# Patient Record
Sex: Male | Born: 1955 | Race: White | Hispanic: No | Marital: Married | Smoking: Current some day smoker
Health system: Southern US, Community
[De-identification: ages and names within clinical notes are randomized; demographics above are authoritative.]

## PROBLEM LIST (undated history)

## (undated) DIAGNOSIS — G459 Transient cerebral ischemic attack, unspecified: Secondary | ICD-10-CM

## (undated) DIAGNOSIS — I639 Cerebral infarction, unspecified: Secondary | ICD-10-CM

## (undated) DIAGNOSIS — I10 Essential (primary) hypertension: Secondary | ICD-10-CM

## (undated) HISTORY — PX: APPENDECTOMY: SHX54

---

## 2016-03-13 ENCOUNTER — Encounter (HOSPITAL_COMMUNITY): Payer: Self-pay | Admitting: *Deleted

## 2016-03-13 ENCOUNTER — Emergency Department (HOSPITAL_COMMUNITY): Payer: Self-pay

## 2016-03-13 ENCOUNTER — Emergency Department (HOSPITAL_COMMUNITY)
Admission: EM | Admit: 2016-03-13 | Discharge: 2016-03-13 | Disposition: A | Discharge status: Home / Self Care | Payer: Self-pay | Attending: Emergency Medicine | Admitting: Emergency Medicine

## 2016-03-13 DIAGNOSIS — F17200 Nicotine dependence, unspecified, uncomplicated: Secondary | ICD-10-CM | POA: Insufficient documentation

## 2016-03-13 DIAGNOSIS — I10 Essential (primary) hypertension: Secondary | ICD-10-CM | POA: Insufficient documentation

## 2016-03-13 DIAGNOSIS — R791 Abnormal coagulation profile: Secondary | ICD-10-CM | POA: Insufficient documentation

## 2016-03-13 DIAGNOSIS — R42 Dizziness and giddiness: Secondary | ICD-10-CM | POA: Insufficient documentation

## 2016-03-13 HISTORY — DX: Transient cerebral ischemic attack, unspecified: G45.9

## 2016-03-13 HISTORY — DX: Essential (primary) hypertension: I10

## 2016-03-13 LAB — RAPID URINE DRUG SCREEN, HOSP PERFORMED
AMPHETAMINES: NOT DETECTED
Barbiturates: NOT DETECTED
Benzodiazepines: NOT DETECTED
Cocaine: NOT DETECTED
OPIATES: NOT DETECTED
TETRAHYDROCANNABINOL: POSITIVE — AB

## 2016-03-13 LAB — CBC
HEMATOCRIT: 47.4 % (ref 39.0–52.0)
Hemoglobin: 16.5 g/dL (ref 13.0–17.0)
MCH: 32.1 pg (ref 26.0–34.0)
MCHC: 34.8 g/dL (ref 30.0–36.0)
MCV: 92.2 fL (ref 78.0–100.0)
PLATELETS: 271 10*3/uL (ref 150–400)
RBC: 5.14 MIL/uL (ref 4.22–5.81)
RDW: 13.5 % (ref 11.5–15.5)
WBC: 11.5 10*3/uL — ABNORMAL HIGH (ref 4.0–10.5)

## 2016-03-13 LAB — COMPREHENSIVE METABOLIC PANEL
ALBUMIN: 4.6 g/dL (ref 3.5–5.0)
ALT: 19 U/L (ref 17–63)
AST: 18 U/L (ref 15–41)
Alkaline Phosphatase: 56 U/L (ref 38–126)
Anion gap: 8 (ref 5–15)
BUN: 12 mg/dL (ref 6–20)
CHLORIDE: 106 mmol/L (ref 101–111)
CO2: 23 mmol/L (ref 22–32)
Calcium: 9.3 mg/dL (ref 8.9–10.3)
Creatinine, Ser: 0.94 mg/dL (ref 0.61–1.24)
GFR calc Af Amer: >60 mL/min (ref >60)
GFR calc non Af Amer: >60 mL/min (ref >60)
GLUCOSE: 105 mg/dL — AB (ref 65–99)
POTASSIUM: 3.7 mmol/L (ref 3.5–5.1)
Sodium: 137 mmol/L (ref 135–145)
Total Bilirubin: 1 mg/dL (ref 0.3–1.2)
Total Protein: 7.4 g/dL (ref 6.5–8.1)

## 2016-03-13 LAB — DIFFERENTIAL
BASOS PCT: 0 %
Basophils Absolute: 0 10*3/uL (ref 0.0–0.1)
EOS ABS: 0 10*3/uL (ref 0.0–0.7)
Eosinophils Relative: 0 %
LYMPHS PCT: 11 %
Lymphs Abs: 1.3 10*3/uL (ref 0.7–4.0)
Monocytes Absolute: 0.8 10*3/uL (ref 0.1–1.0)
Monocytes Relative: 7 %
NEUTROS ABS: 9.4 10*3/uL — AB (ref 1.7–7.7)
Neutrophils Relative %: 82 %

## 2016-03-13 LAB — URINALYSIS, ROUTINE W REFLEX MICROSCOPIC
BILIRUBIN URINE: NEGATIVE
Glucose, UA: NEGATIVE mg/dL
Hgb urine dipstick: NEGATIVE
Leukocytes, UA: NEGATIVE
NITRITE: NEGATIVE
PH: 5.5 (ref 5.0–8.0)
Protein, ur: NEGATIVE mg/dL
Specific Gravity, Urine: >1.03 — ABNORMAL HIGH (ref 1.005–1.030)

## 2016-03-13 LAB — APTT: aPTT: 31 seconds (ref 24–36)

## 2016-03-13 LAB — PROTIME-INR
INR: 0.94
PROTHROMBIN TIME: 12.6 s (ref 11.4–15.2)

## 2016-03-13 LAB — ETHANOL: Alcohol, Ethyl (B): 5 mg/dL — ABNORMAL HIGH (ref <5)

## 2016-03-13 NOTE — Discharge Instructions (Signed)
Follow up with a primary care doctor in a week or so to be rechecked, return as needed for worsening symptoms

## 2016-03-13 NOTE — ED Notes (Signed)
Per pt, ok to give any information to friend, Cala BradfordKimberly.

## 2016-03-13 NOTE — ED Provider Notes (Signed)
[facility]-EMERGENCY DEPT Provider Note   CSN: [id number] Arrival date & time: 03/13/16  1559     History   Chief Complaint Chief Complaint  Patient presents with  . Dizziness    HPI Roberto Fitzpatrick is a 60 y.o. male.  HPI Pt states his family told him to get checked out.  He was told that one side of his face was drawing and then it switched to the other side of his face.  No trouble with his speech.  No weakness on one side of the other.  He did noticed some numbness in the tip of his right index finger though.  No falls.  Pt is still walking ok.  No headache.   He does have history of TIA.  He does feel a little unsteady but that has been for years.  Right now he feels fine. Past Medical History:  Diagnosis Date  . Hypertension   . TIA (transient ischemic attack)     There are no active problems to display for this patient.   Past Surgical History:  Procedure Laterality Date  . APPENDECTOMY         Home Medications    Prior to Admission medications   Not on File    Family History No family history on file.  Social History Social History  Substance Use Topics  . Smoking status: Current Some Day Smoker  . Smokeless tobacco: Never Used  . Alcohol use Yes     Allergies   Review of patient's allergies indicates no known allergies.   Review of Systems Review of Systems  All other systems reviewed and are negative.    Physical Exam Updated Vital Signs BP 117/73   Pulse (!) 58   Temp 98.6 F (37 C)   Resp 17   Ht 5\' 9"  (1.753 m)   Wt 68 kg   SpO2 95%   BMI 22.15 kg/m   Physical Exam  Constitutional: He is oriented to person, place, and time. He appears well-developed and well-nourished. No distress.  HENT:  Head: Normocephalic and atraumatic.  Right Ear: External ear normal.  Left Ear: External ear normal.  Mouth/Throat: Oropharynx is clear and moist.  Eyes: Conjunctivae are normal. Right eye exhibits no discharge. Left eye exhibits no  discharge. No scleral icterus.  Neck: Neck supple. No tracheal deviation present.  Cardiovascular: Normal rate, regular rhythm and intact distal pulses.   Pulmonary/Chest: Effort normal and breath sounds normal. No stridor. No respiratory distress. He has no wheezes. He has no rales.  Abdominal: Soft. Bowel sounds are normal. He exhibits no distension. There is no tenderness. There is no rebound and no guarding.  Musculoskeletal: He exhibits no edema or tenderness.  Neurological: He is alert and oriented to person, place, and time. He has normal strength. No cranial nerve deficit (No facial droop, extraocular movements intact, tongue midline ) or sensory deficit. He exhibits normal muscle tone. He displays no seizure activity. Coordination normal.  No pronator drift bilateral upper extrem, able to hold both legs off bed for 5 seconds, sensation intact in all extremities, no visual field cuts, no left or right sided neglect, normal finger-nose exam bilaterally, no nystagmus noted   Skin: Skin is warm and dry. No rash noted.  Psychiatric: He has a normal mood and affect.  Nursing note and vitals reviewed.    ED Treatments / Results  Labs (all labs ordered are listed, but only abnormal results are displayed) Labs Reviewed  ETHANOL - Abnormal;  Notable for the following:       Result Value   Alcohol, Ethyl (B) 5 (*)    All other components within normal limits  CBC - Abnormal; Notable for the following:    WBC 11.5 (*)    All other components within normal limits  DIFFERENTIAL - Abnormal; Notable for the following:    Neutro Abs 9.4 (*)    All other components within normal limits  COMPREHENSIVE METABOLIC PANEL - Abnormal; Notable for the following:    Glucose, Bld 105 (*)    All other components within normal limits  RAPID URINE DRUG SCREEN, HOSP PERFORMED - Abnormal; Notable for the following:    Tetrahydrocannabinol POSITIVE (*)    All other components within normal limits    URINALYSIS, ROUTINE W REFLEX MICROSCOPIC (NOT AT [facility]) - Abnormal; Notable for the following:    Specific Gravity, Urine >1.030 (*)    Ketones, ur TRACE (*)    All other components within normal limits  PROTIME-INR  APTT  I-STAT TROPOININ, ED    Radiology Ct Head Wo Contrast  Result Date: 03/13/2016 CLINICAL DATA:  Status post fall very EXAM: CT HEAD WITHOUT CONTRAST TECHNIQUE: Contiguous axial images were obtained from the base of the skull through the vertex without intravenous contrast. COMPARISON:  None. FINDINGS: Brain: There is no evidence for acute hemorrhage, hydrocephalus, mass lesion, or abnormal extra-axial fluid collection. No definite CT evidence for acute infarction. Diffuse loss of parenchymal volume is consistent with atrophy. Patchy low attenuation in the deep hemispheric and periventricular white matter is nonspecific, but likely reflects chronic microvascular ischemic demyelination. Vascular: Atherosclerotic calcification is visualized in the carotid arteries. No dense MCA sign. Major dural sinuses are unremarkable. Skull: Normal. Negative for fracture or focal lesion. Diffusely demineralized. Sinuses/Orbits: No acute finding. Other: None. IMPRESSION: 1. No acute intracranial abnormality. 2. Atrophy with chronic small vessel white matter ischemic disease. Electronically Signed   By: Kennith CenterEric  Mansell M.D.   On: 03/13/2016 17:25    Procedures Procedures (including critical care time)  Medications Ordered in ED Medications - No data to display   Initial Impression / Assessment and Plan / ED Course  I have reviewed the triage vital signs and the nursing notes.  Pertinent labs & imaging results that were available during my care of the patient were reviewed by me and considered in my medical decision making (see chart for details).  Clinical Course    Exam is normal.  No facial droop.  No ataxia.  Pt denies any symptoms.  Labs and CT are reassuring.  Doubt TIA,  stroke.  At this time there does not appear to be any evidence of an acute emergency medical condition and the patient appears stable for discharge with appropriate outpatient follow up.   Final Clinical Impressions(s) / ED Diagnoses   Final diagnoses:  Dizziness    New Prescriptions New Prescriptions   No medications on file     Linwood DibblesJon Dayn Barich, MD 03/13/16 2024

## 2016-03-13 NOTE — ED Triage Notes (Signed)
Per ems, friend came by and states he had fallen, patient denies any fall ,but states he is unsteady on his feet. History of TIA'S

## 2016-03-14 LAB — I-STAT TROPONIN, ED: Troponin i, poc: 0 ng/mL (ref 0.00–0.08)

## 2017-06-14 ENCOUNTER — Other Ambulatory Visit: Payer: Self-pay

## 2017-06-14 ENCOUNTER — Emergency Department (HOSPITAL_COMMUNITY)
Admission: EM | Admit: 2017-06-14 | Discharge: 2017-06-15 | Disposition: A | Discharge status: Home / Self Care | Payer: Self-pay | Attending: Emergency Medicine | Admitting: Emergency Medicine

## 2017-06-14 ENCOUNTER — Encounter (HOSPITAL_COMMUNITY): Payer: Self-pay | Admitting: Emergency Medicine

## 2017-06-14 DIAGNOSIS — I10 Essential (primary) hypertension: Secondary | ICD-10-CM | POA: Insufficient documentation

## 2017-06-14 DIAGNOSIS — W19XXXA Unspecified fall, initial encounter: Secondary | ICD-10-CM

## 2017-06-14 DIAGNOSIS — F17200 Nicotine dependence, unspecified, uncomplicated: Secondary | ICD-10-CM | POA: Insufficient documentation

## 2017-06-14 DIAGNOSIS — M79671 Pain in right foot: Secondary | ICD-10-CM | POA: Insufficient documentation

## 2017-06-14 DIAGNOSIS — Z8673 Personal history of transient ischemic attack (TIA), and cerebral infarction without residual deficits: Secondary | ICD-10-CM | POA: Insufficient documentation

## 2017-06-14 DIAGNOSIS — R2689 Other abnormalities of gait and mobility: Secondary | ICD-10-CM

## 2017-06-14 DIAGNOSIS — F10929 Alcohol use, unspecified with intoxication, unspecified: Secondary | ICD-10-CM | POA: Insufficient documentation

## 2017-06-14 DIAGNOSIS — R51 Headache: Secondary | ICD-10-CM | POA: Insufficient documentation

## 2017-06-14 DIAGNOSIS — W109XXA Fall (on) (from) unspecified stairs and steps, initial encounter: Secondary | ICD-10-CM | POA: Insufficient documentation

## 2017-06-14 DIAGNOSIS — Y998 Other external cause status: Secondary | ICD-10-CM | POA: Insufficient documentation

## 2017-06-14 DIAGNOSIS — Y92009 Unspecified place in unspecified non-institutional (private) residence as the place of occurrence of the external cause: Secondary | ICD-10-CM

## 2017-06-14 DIAGNOSIS — F1010 Alcohol abuse, uncomplicated: Secondary | ICD-10-CM

## 2017-06-14 DIAGNOSIS — Y939 Activity, unspecified: Secondary | ICD-10-CM | POA: Insufficient documentation

## 2017-06-14 DIAGNOSIS — Y929 Unspecified place or not applicable: Secondary | ICD-10-CM | POA: Insufficient documentation

## 2017-06-14 HISTORY — DX: Cerebral infarction, unspecified: I63.9

## 2017-06-14 NOTE — ED Provider Notes (Signed)
[facility] EMERGENCY DEPARTMENT Provider Note   CSN: [id number] Arrival date & time: 06/14/17  2148     History   Chief Complaint Chief Complaint  Patient presents with  . Fall    HPI Roberto Fitzpatrick is a 62 y.o. male.  Patient is a 62 year old male who presents to the emergency department by rocking him [facility] after falling down steps.  The history is provided by both the patient and the family.  The family says that at times he gets some things confused because he has had possibly 3 strokes.  The family states that the patient frequently has fall.  He has a problem with his balance.  Today he fell down approximately 10 or 11 steps at his home.  The patient states that he has been drinking a 20 ounce beer, but the patient states that that had nothing to do with his fall.  The patient denies pain at this time.  He said initially he had some pain of his head, but he does not have any pain now.  He thinks that he may have passed out for short period of time, but says he woke up on his own.  Patient denies being on any anticoagulation medications at this time.  He presents now for assistance with this issue.      Past Medical History:  Diagnosis Date  . Hypertension   . Stroke (HCC)   . TIA (transient ischemic attack)     There are no active problems to display for this patient.   Past Surgical History:  Procedure Laterality Date  . APPENDECTOMY         Home Medications    Prior to Admission medications   Not on File    Family History No family history on file.  Social History Social History   Tobacco Use  . Smoking status: Current Some Day Smoker  . Smokeless tobacco: Never Used  Substance Use Topics  . Alcohol use: Yes  . Drug use: No     Allergies   Patient has no known allergies.   Review of Systems Review of Systems  Constitutional: Negative for activity change.       All ROS Neg except as noted in HPI  HENT: Negative for nosebleeds.     Eyes: Negative for photophobia and discharge.  Respiratory: Negative for cough, shortness of breath and wheezing.   Cardiovascular: Negative for chest pain and palpitations.  Gastrointestinal: Negative for abdominal pain and blood in stool.  Genitourinary: Negative for dysuria, frequency and hematuria.  Musculoskeletal: Negative for arthralgias, back pain and neck pain.  Skin: Negative.   Neurological: Positive for speech difficulty and light-headedness. Negative for dizziness and seizures.  Psychiatric/Behavioral: Negative for confusion and hallucinations.     Physical Exam Updated Vital Signs BP (!) 147/98 (BP Location: Right Arm)   Pulse 92   Temp 97.7 F (36.5 C) (Oral)   Resp 18   Ht 5\' 9"  (1.753 m)   Wt 65.8 kg (145 lb)   SpO2 97%   BMI 21.41 kg/m   Physical Exam  Constitutional: He is oriented to person, place, and time. He appears well-developed and well-nourished.  Non-toxic appearance.  HENT:  Head: Normocephalic.  Right Ear: Tympanic membrane and external ear normal.  Left Ear: Tympanic membrane and external ear normal.  No hematoma.  Negative Battle's sign.  Eyes: EOM and lids are normal. Pupils are equal, round, and reactive to light.  Neck: Normal range of motion.  Neck supple. Carotid bruit is not present. No tracheal deviation present.  Cardiovascular: Normal rate, regular rhythm, normal heart sounds, intact distal pulses and normal pulses.  Pulmonary/Chest: Breath sounds normal. No respiratory distress.  Abdominal: Soft. Bowel sounds are normal. There is no tenderness. There is no guarding.  Musculoskeletal: Normal range of motion. He exhibits no edema, tenderness or deformity.  Lymphadenopathy:       Head (right side): No submandibular adenopathy present.       Head (left side): No submandibular adenopathy present.    He has no cervical adenopathy.  Neurological: He is alert and oriented to person, place, and time. He has normal strength. No cranial nerve  deficit or sensory deficit. Coordination abnormal.  Patient has a problem with balance.  He also has some mild problems with finger to nose coordination.  The family states this is not new.  This is been going on since the patient's previous strokes.  Skin: Skin is warm and dry.  Psychiatric: He has a normal mood and affect. His speech is normal.  Nursing note and vitals reviewed.    ED Treatments / Results  Labs (all labs ordered are listed, but only abnormal results are displayed) Labs Reviewed - No data to display  EKG  EKG Interpretation None       Radiology No results found.  Procedures Procedures (including critical care time)  Medications Ordered in ED Medications - No data to display   Initial Impression / Assessment and Plan / ED Course  I have reviewed the triage vital signs and the nursing notes.  Pertinent labs & imaging results that were available during my care of the patient were reviewed by me and considered in my medical decision making (see chart for details).       Final Clinical Impressions(s) / ED Diagnoses MDM Vital signs within normal limits.  Pulse oximetry is 96% on room air.  Within normal limits by my interpretation.  The patient at times has some problems speaking and getting his thoughts together, but the family states this is his usual baseline.  He has some mild coordination issues and they state these have been going on since his previous strokes.  EtOH level is 33 t, which is mildly elevated.   Patient states that he drinks alcohol every day.  He chest x-ray is negative for any fractures or changes in the lungs or chest area.  The CT scan of the head is negative for acute intracranial abnormality.  There is no skull fracture noted.  CT scan of the cervical spine reveals some degenerative disc disease at C5-C6 and arthritis at multiple areas.  There is no fracture or dislocation appreciated.  Patient was ambulated in the room with assistance.   Feel that it is safe for the patient to be discharged home with his family and friends.  He has a caregiver that is in the home with him at all times.  The family is asked to see the primary physician or return to the emergency department if any changes, problems, or concerns.  Family and friends are going to take the patient home.  Patient has a caregiver at home at all times.   Final diagnoses:  Fall, initial encounter  ETOH abuse    ED Discharge Orders    None       Ivery Quale, PA-C 06/15/17 Gennaro Africa, MD 06/15/17 [phone]

## 2017-06-14 NOTE — ED Triage Notes (Signed)
Brought in [facility] after falling down approximately 10-12 steps at home. Pt has admitted to drinking tonight. States he only drank 20oz beer but talks like he has had more. No injuries noted. CBG by EMS was 73.

## 2017-06-15 ENCOUNTER — Emergency Department (HOSPITAL_COMMUNITY): Payer: Self-pay

## 2017-06-15 LAB — URINALYSIS, ROUTINE W REFLEX MICROSCOPIC
Bacteria, UA: NONE SEEN
Bilirubin Urine: NEGATIVE
GLUCOSE, UA: 50 mg/dL — AB
Ketones, ur: NEGATIVE mg/dL
Leukocytes, UA: NEGATIVE
Nitrite: NEGATIVE
Protein, ur: NEGATIVE mg/dL
SPECIFIC GRAVITY, URINE: 1.017 (ref 1.005–1.030)
pH: 5 (ref 5.0–8.0)

## 2017-06-15 LAB — BASIC METABOLIC PANEL
ANION GAP: 12 (ref 5–15)
BUN: 15 mg/dL (ref 6–20)
CO2: 25 mmol/L (ref 22–32)
Calcium: 9.3 mg/dL (ref 8.9–10.3)
Chloride: 100 mmol/L — ABNORMAL LOW (ref 101–111)
Creatinine, Ser: 0.97 mg/dL (ref 0.61–1.24)
Glucose, Bld: 88 mg/dL (ref 65–99)
Potassium: 3.7 mmol/L (ref 3.5–5.1)
SODIUM: 137 mmol/L (ref 135–145)

## 2017-06-15 LAB — CBC WITH DIFFERENTIAL/PLATELET
BASOS ABS: 0 10*3/uL (ref 0.0–0.1)
BASOS PCT: 0 %
EOS ABS: 0.1 10*3/uL (ref 0.0–0.7)
Eosinophils Relative: 0 %
HCT: 48 % (ref 39.0–52.0)
HEMOGLOBIN: 16.6 g/dL (ref 13.0–17.0)
LYMPHS ABS: 1.3 10*3/uL (ref 0.7–4.0)
Lymphocytes Relative: 8 %
MCH: 31.8 pg (ref 26.0–34.0)
MCHC: 34.6 g/dL (ref 30.0–36.0)
MCV: 92 fL (ref 78.0–100.0)
Monocytes Absolute: 0.9 10*3/uL (ref 0.1–1.0)
Monocytes Relative: 5 %
NEUTROS PCT: 87 %
Neutro Abs: 14.3 10*3/uL — ABNORMAL HIGH (ref 1.7–7.7)
Platelets: 302 10*3/uL (ref 150–400)
RBC: 5.22 MIL/uL (ref 4.22–5.81)
RDW: 13.7 % (ref 11.5–15.5)
WBC: 16.6 10*3/uL — AB (ref 4.0–10.5)

## 2017-06-15 LAB — ETHANOL: ALCOHOL ETHYL (B): 33 mg/dL — AB (ref <10)

## 2017-06-15 NOTE — ED Notes (Signed)
Stood patient up at side of bed, pt had a hard time walking, lost his balance several times. Family at bedside stated patient walks and has several falls at home from loosing balance. Pt states he is ready to go home. Pt seems to have a sore foot on the left side. Pt is able to do full range of motion and when assessing foot there is no redness or abrasion.

## 2017-06-15 NOTE — Discharge Instructions (Addendum)
Vital signs within normal limits.  Pulse oximetry is 96% on room air.  Within normal limits by my interpretation.  Your alcohol level is slightly elevated at 33.  The chest x-ray is negative for rib fracture or other injuries or trauma to the chest area.  The CT scan of the head is negative for skull fracture or brain related injury.  The CT scan of the cervical spine is negative for fracture or dislocation.  There are arthritis changes throughout the cervical spine.  There is degenerative disc disease particularly at the C5-C6 area. Your electrolytes are within normal limits.  Please use Tylenol every 4 hours for soreness, or ibuprofen every 6 hours if needed for soreness.  Please refrain from using alcohol.  Please see your primary physician, or return to the emergency department if any changes, problems, or concerns.

## 2018-03-25 ENCOUNTER — Encounter (HOSPITAL_COMMUNITY): Payer: Self-pay

## 2018-03-25 ENCOUNTER — Observation Stay (HOSPITAL_COMMUNITY)
Admission: EM | Admit: 2018-03-25 | Discharge: 2018-03-27 | Disposition: A | Discharge status: Home / Self Care | Payer: No Typology Code available for payment source | Attending: Family Medicine | Admitting: Family Medicine

## 2018-03-25 ENCOUNTER — Other Ambulatory Visit: Payer: Self-pay

## 2018-03-25 ENCOUNTER — Emergency Department (HOSPITAL_COMMUNITY): Payer: Self-pay

## 2018-03-25 DIAGNOSIS — F17200 Nicotine dependence, unspecified, uncomplicated: Secondary | ICD-10-CM | POA: Insufficient documentation

## 2018-03-25 DIAGNOSIS — K625 Hemorrhage of anus and rectum: Principal | ICD-10-CM | POA: Diagnosis present

## 2018-03-25 DIAGNOSIS — Z7982 Long term (current) use of aspirin: Secondary | ICD-10-CM | POA: Insufficient documentation

## 2018-03-25 DIAGNOSIS — K922 Gastrointestinal hemorrhage, unspecified: Secondary | ICD-10-CM

## 2018-03-25 DIAGNOSIS — Z79899 Other long term (current) drug therapy: Secondary | ICD-10-CM | POA: Insufficient documentation

## 2018-03-25 DIAGNOSIS — I639 Cerebral infarction, unspecified: Secondary | ICD-10-CM | POA: Diagnosis present

## 2018-03-25 DIAGNOSIS — Z8673 Personal history of transient ischemic attack (TIA), and cerebral infarction without residual deficits: Secondary | ICD-10-CM | POA: Insufficient documentation

## 2018-03-25 DIAGNOSIS — R4182 Altered mental status, unspecified: Secondary | ICD-10-CM | POA: Insufficient documentation

## 2018-03-25 DIAGNOSIS — I10 Essential (primary) hypertension: Secondary | ICD-10-CM | POA: Insufficient documentation

## 2018-03-25 DIAGNOSIS — K921 Melena: Secondary | ICD-10-CM | POA: Diagnosis present

## 2018-03-25 LAB — CBC WITH DIFFERENTIAL/PLATELET
ABS IMMATURE GRANULOCYTES: 0.03 10*3/uL (ref 0.00–0.07)
BASOS PCT: 0 %
Basophils Absolute: 0 10*3/uL (ref 0.0–0.1)
EOS ABS: 0 10*3/uL (ref 0.0–0.5)
Eosinophils Relative: 0 %
HCT: 42.9 % (ref 39.0–52.0)
Hemoglobin: 13.5 g/dL (ref 13.0–17.0)
Immature Granulocytes: 0 %
Lymphocytes Relative: 11 %
Lymphs Abs: 1 10*3/uL (ref 0.7–4.0)
MCH: 27.6 pg (ref 26.0–34.0)
MCHC: 31.5 g/dL (ref 30.0–36.0)
MCV: 87.7 fL (ref 80.0–100.0)
MONO ABS: 0.8 10*3/uL (ref 0.1–1.0)
Monocytes Relative: 9 %
NEUTROS ABS: 7.7 10*3/uL (ref 1.7–7.7)
Neutrophils Relative %: 80 %
PLATELETS: 442 10*3/uL — AB (ref 150–400)
RBC: 4.89 MIL/uL (ref 4.22–5.81)
RDW: 15.1 % (ref 11.5–15.5)
WBC: 9.6 10*3/uL (ref 4.0–10.5)
nRBC: 0 % (ref 0.0–0.2)

## 2018-03-25 LAB — RAPID URINE DRUG SCREEN, HOSP PERFORMED
Amphetamines: NOT DETECTED
BARBITURATES: NOT DETECTED
Benzodiazepines: NOT DETECTED
COCAINE: NOT DETECTED
Opiates: NOT DETECTED
TETRAHYDROCANNABINOL: POSITIVE — AB

## 2018-03-25 LAB — COMPREHENSIVE METABOLIC PANEL
ALT: 9 U/L (ref 0–44)
ANION GAP: 6 (ref 5–15)
AST: 16 U/L (ref 15–41)
Albumin: 3.7 g/dL (ref 3.5–5.0)
Alkaline Phosphatase: 98 U/L (ref 38–126)
BUN: 14 mg/dL (ref 8–23)
CALCIUM: 9.1 mg/dL (ref 8.9–10.3)
CO2: 27 mmol/L (ref 22–32)
Chloride: 104 mmol/L (ref 98–111)
Creatinine, Ser: 0.93 mg/dL (ref 0.61–1.24)
GFR calc Af Amer: >60 mL/min (ref >60)
Glucose, Bld: 98 mg/dL (ref 70–99)
POTASSIUM: 3.5 mmol/L (ref 3.5–5.1)
Sodium: 137 mmol/L (ref 135–145)
TOTAL PROTEIN: 7.1 g/dL (ref 6.5–8.1)
Total Bilirubin: 0.7 mg/dL (ref 0.3–1.2)

## 2018-03-25 LAB — AMMONIA: Ammonia: 11 umol/L (ref 9–35)

## 2018-03-25 LAB — URINALYSIS, ROUTINE W REFLEX MICROSCOPIC
Bacteria, UA: NONE SEEN
Bilirubin Urine: NEGATIVE
Glucose, UA: NEGATIVE mg/dL
Hgb urine dipstick: NEGATIVE
Ketones, ur: NEGATIVE mg/dL
Nitrite: NEGATIVE
Protein, ur: NEGATIVE mg/dL
SPECIFIC GRAVITY, URINE: 1.021 (ref 1.005–1.030)
pH: 6 (ref 5.0–8.0)

## 2018-03-25 LAB — TYPE AND SCREEN
ABO/RH(D): O POS
Antibody Screen: NEGATIVE

## 2018-03-25 LAB — PROTIME-INR
INR: 1
PROTHROMBIN TIME: 13.1 s (ref 11.4–15.2)

## 2018-03-25 LAB — LIPASE, BLOOD: Lipase: 32 U/L (ref 11–51)

## 2018-03-25 LAB — ETHANOL: Alcohol, Ethyl (B): <10 mg/dL (ref <10)

## 2018-03-25 MED ORDER — ONDANSETRON HCL 4 MG/2ML IJ SOLN: 4.0000 mg | Freq: Four times a day (QID) | INTRAMUSCULAR | Status: DC | PRN

## 2018-03-25 MED ORDER — POTASSIUM CHLORIDE IN NACL 40-0.9 MEQ/L-% IV SOLN
INTRAVENOUS | Status: DC
  Administered 2018-03-25: 75 mL/h via INTRAVENOUS
  Filled 2018-03-25: qty 1000

## 2018-03-25 MED ORDER — SODIUM CHLORIDE 0.9 % IV SOLN
INTRAVENOUS | Status: DC
  Administered 2018-03-25: 21:00:00 via INTRAVENOUS

## 2018-03-25 MED ORDER — SODIUM CHLORIDE 0.9 % IV SOLN
80.0000 mg | Freq: Once | INTRAVENOUS | Status: AC
  Administered 2018-03-26: 80 mg via INTRAVENOUS
  Filled 2018-03-25: qty 80

## 2018-03-25 MED ORDER — SODIUM CHLORIDE 0.9 % IV BOLUS
1000.0000 mL | Freq: Once | INTRAVENOUS | Status: AC
  Administered 2018-03-25: 1000 mL via INTRAVENOUS

## 2018-03-25 MED ORDER — ONDANSETRON HCL 4 MG PO TABS: 4.0000 mg | ORAL_TABLET | Freq: Four times a day (QID) | ORAL | Status: DC | PRN

## 2018-03-25 MED ORDER — SODIUM CHLORIDE 0.9 % IV SOLN
8.0000 mg/h | INTRAVENOUS | Status: DC
  Administered 2018-03-26: 8 mg/h via INTRAVENOUS
  Filled 2018-03-25 (×5): qty 80

## 2018-03-25 NOTE — ED Provider Notes (Signed)
[facility] EMERGENCY DEPARTMENT Provider Note   CSN: [id number] Arrival date & time: 03/25/18  1812     History   Chief Complaint Chief Complaint  Patient presents with  . Rectal Bleeding    HPI Roberto Fitzpatrick is a 62 y.o. male.  Pt presents to the ED today with rectal bleeding.  The pt is a poor historian and is unable to give much hx.  The pt's father is with him and he gives the hx.  The father reports that the pt has had black stools for about 6 months.  He has gone to the [place] for this, but the father does not know what pt's been told.  The woman who stays with this patient called the dad tonight and told him that he had bright red rectal bleeding.  Pt denies any pain.  Dad does not think pt has ever seen a GI doctor, but is unsure.  Pt has a hx of alcohol abuse, but father denies that pt has been drinking.  Father said he's been falling a lot.     Past Medical History:  Diagnosis Date  . Hypertension   . Stroke (HCC)   . TIA (transient ischemic attack)     There are no active problems to display for this patient.   Past Surgical History:  Procedure Laterality Date  . APPENDECTOMY          Home Medications    Prior to Admission medications   Medication Sig Start Date End Date Taking? Authorizing Provider  aspirin EC 81 MG tablet Take 81 mg by mouth daily.   Yes [provider]  donepezil (ARICEPT) 10 MG tablet Take 5 mg by mouth at bedtime.   Yes [provider]  Meclizine HCl 25 MG CHEW Chew 25 mg by mouth at bedtime.   Yes [provider]  simvastatin (ZOCOR) 20 MG tablet Take 20 mg by mouth daily.   Yes [provider]  vitamin B-12 (CYANOCOBALAMIN) 500 MCG tablet Take 500 mcg by mouth daily.   Yes [provider]    Family History No family history on file.  Social History Social History   Tobacco Use  . Smoking status: Current Some Day Smoker  . Smokeless tobacco: Never Used  Substance Use Topics  .  Alcohol use: Yes  . Drug use: No     Allergies   Patient has no known allergies.   Review of Systems Review of Systems  Gastrointestinal:       Rectal bleeding  All other systems reviewed and are negative.    Physical Exam Updated Vital Signs BP (!) 145/78   Pulse 72   Temp 98.2 F (36.8 C) (Oral)   Resp 14   Wt 68.5 kg   SpO2 98%   BMI 22.30 kg/m   Physical Exam  Constitutional: He appears well-developed and well-nourished.  HENT:  Head: Normocephalic and atraumatic.  Right Ear: External ear normal.  Left Ear: External ear normal.  Nose: Nose normal.  Mouth/Throat: Oropharynx is clear and moist.  Eyes: Conjunctivae and EOM are normal.  Pupils pinpoint  Neck: Normal range of motion. Neck supple.  Cardiovascular: Normal rate, regular rhythm, normal heart sounds and intact distal pulses.  Pulmonary/Chest: Effort normal and breath sounds normal.  Abdominal: Soft. Bowel sounds are normal.  Genitourinary: Rectal exam shows guaiac positive stool.  Musculoskeletal: Normal range of motion.  Neurological: He is alert.  Pt is awake and alert.  He answers questions  and follows commands, but it takes him awhile to do it.  Skin: Skin is warm. Capillary refill takes less than 2 seconds.  Psychiatric: His speech is delayed. He is slowed.  Nursing note and vitals reviewed.    ED Treatments / Results  Labs (all labs ordered are listed, but only abnormal results are displayed) Labs Reviewed  CBC WITH DIFFERENTIAL/PLATELET - Abnormal; Notable for the following components:      Result Value   Platelets 442 (*)    All other components within normal limits  COMPREHENSIVE METABOLIC PANEL  PROTIME-INR  AMMONIA  ETHANOL  LIPASE, BLOOD  RAPID URINE DRUG SCREEN, HOSP PERFORMED  URINALYSIS, ROUTINE W REFLEX MICROSCOPIC  POC OCCULT BLOOD, ED  TYPE AND SCREEN    EKG None  Radiology Dg Chest 2 View  Result Date: 03/25/2018 CLINICAL DATA:  Rectal bleeding EXAM: CHEST -  2 VIEW COMPARISON:  06/15/2017 FINDINGS: Hyperinflation. No focal airspace disease or effusion. Normal heart size. No pneumothorax. Multiple old bilateral rib fractures. Old bilateral clavicle fractures. IMPRESSION: No active cardiopulmonary disease. Electronically Signed   By: Jasmine Pang M.D.   On: 03/25/2018 19:53   Ct Head Wo Contrast  Result Date: 03/25/2018 CLINICAL DATA:  Altered level of consciousness. EXAM: CT HEAD WITHOUT CONTRAST TECHNIQUE: Contiguous axial images were obtained from the base of the skull through the vertex without intravenous contrast. COMPARISON:  01/20/2018 FINDINGS: Brain: There is atrophy and chronic small vessel disease changes. Left thalamic lacunar infarct, old. No acute intracranial abnormality. Specifically, no hemorrhage, hydrocephalus, mass lesion, acute infarction, or significant intracranial injury. Vascular: No hyperdense vessel or unexpected calcification. Skull: No acute calvarial abnormality. Sinuses/Orbits: Visualized paranasal sinuses and mastoids clear. Orbital soft tissues unremarkable. Other: None IMPRESSION: No acute intracranial abnormality. Atrophy, chronic microvascular disease. Old left thalamic lacunar infarct. Electronically Signed   By: Charlett Nose M.D.   On: 03/25/2018 20:34    Procedures Procedures (including critical care time)  Medications Ordered in ED Medications  sodium chloride 0.9 % bolus 1,000 mL (0 mLs Intravenous Stopped 03/25/18 2035)    And  0.9 %  sodium chloride infusion ( Intravenous New Bag/Given 03/25/18 2040)  pantoprazole (PROTONIX) 80 mg in sodium chloride 0.9 % 100 mL IVPB (has no administration in time range)  pantoprazole (PROTONIX) 80 mg in sodium chloride 0.9 % 250 mL (0.32 mg/mL) infusion (has no administration in time range)     Initial Impression / Assessment and Plan / ED Course  I have reviewed the triage vital signs and the nursing notes.  Pertinent labs & imaging results that were available during  my care of the patient were reviewed by me and considered in my medical decision making (see chart for details).    Pt is not on blood thinners, but does take ASA.  Hgb is normal now, but it has dropped from 16.6 back in January.    Pt d/w Dr. Mariea Clonts (triad) for admission.  Final Clinical Impressions(s) / ED Diagnoses   Final diagnoses:  GI bleed    ED Discharge Orders    None       Jacalyn Lefevre, MD 03/25/18 2142

## 2018-03-25 NOTE — ED Notes (Signed)
CONTACT BROTHER DENNIS AT [phone] (HOME)/[phone](CELL) if Pt becomes delirious and family presence is needed.

## 2018-03-25 NOTE — ED Triage Notes (Signed)
Per EMS, pt has been having some rectal bleeding. When asking the patient, he is slow to respond and is "spaced out."

## 2018-03-25 NOTE — H&P (Signed)
History and Physical    Roberto Fitzpatrick UJW:[id number] DOB: 09-26-1955 DOA: 03/25/2018  PCP: Patient, No Pcp Per   Patient coming from: Home  Chief Complaint: Rectal bleeding  HPI: ISACC TURNEY is a 62 y.o. male with medical history significant for CVA, tobacco abuse who presented to the ED with reports of rectal bleeding and or black stools intermittent over the past 6 months, and then today.  Patient is not able to give me a lot of detail but older brother is at bedside and gives most of the history.  Patient tells me he did not look at his stool.  Apparently patient significant order who lives with patient reported the black stools and bleeding per rectum.  He denies vomiting or abdominal pain. Denies NSAID use.  Patient is on daily aspirin 81 mg.  He had a CVA about 3 years ago.  And since then he has had memory problems/cognitive deficits, with slowed speech, abnormal gait.  Per patient and his brother patient is at bedside and is not confused.  Patient's brother denies any alcohol intake, since last ED visit 9 months ago when blood alcohol level was 33. PAtient was supposed to have GI evaluation at the [place] but this has not been done.  And has never had a colonoscopy or EGD. Brother reports and falls a lot sustaining bruises on his extremities, to his abnormal gait from CVA, but he refuses to use assistive devices to ambulate.  ED Course: Stable vitals blood pressure systolic greater than 134.  Hemoglobin down to 13.5 from baseline ~ 16, normal platelets 442. negative blood alcohol level.  Head CT-old left thalamic lacunar infarct.  Two-view chest x-ray negative for acute abnormality.  FOBT pending. Patient and was started on Protonix GTT in the ED hospitalist was called to admit for GI bleed.  Review of Systems: As per HPI all other systems reviewed and negative.  Past Medical History:  Diagnosis Date  . Hypertension   . Stroke (HCC)   . TIA (transient ischemic attack)     Past  Surgical History:  Procedure Laterality Date  . APPENDECTOMY       reports that he has been smoking. He has never used smokeless tobacco. He reports that he does not use drugs.  Smokes 1 pack/day  No Known Allergies  Family History  Problem Relation Age of Onset  . Hypertension Mother   . Hypertension Brother     Prior to Admission medications   Medication Sig Start Date End Date Taking? Authorizing Provider  aspirin EC 81 MG tablet Take 81 mg by mouth daily.   Yes [provider]  donepezil (ARICEPT) 10 MG tablet Take 5 mg by mouth at bedtime.   Yes [provider]  Meclizine HCl 25 MG CHEW Chew 25 mg by mouth at bedtime.   Yes [provider]  simvastatin (ZOCOR) 20 MG tablet Take 20 mg by mouth daily.   Yes [provider]  vitamin B-12 (CYANOCOBALAMIN) 500 MCG tablet Take 500 mcg by mouth daily.   Yes [provider]    Physical Exam: Vitals:   03/25/18 1817 03/25/18 1819 03/25/18 2000  BP:  134/85 (!) 145/78  Pulse:  76 72  Resp:  12 14  Temp:  98.2 F (36.8 C)   TempSrc:  Oral   SpO2:  94% 98%  Weight: 68.5 kg      Constitutional: NAD, calm, comfortable Vitals:   03/25/18 1817 03/25/18 1819 03/25/18 2000  BP:  134/85 (!) 145/78  Pulse:  76 72  Resp:  12 14  Temp:  98.2 F (36.8 C)   TempSrc:  Oral   SpO2:  94% 98%  Weight: 68.5 kg     Eyes: PERRL, lids and conjunctivae normal ENMT: Mucous membranes are moist, has bruising on left upper lip- ?fall.   Posterior pharynx clear of any exudate or lesions.Normal dentition.  Neck: normal, supple, no masses, no thyromegaly Respiratory: clear to auscultation bilaterally, no wheezing, no crackles. Normal respiratory effort. No accessory muscle use.  Cardiovascular: Regular rate and rhythm, no murmurs / rubs / gallops. No extremity edema. 2+ pedal pulses. No carotid bruits.  Abdomen: no tenderness, no masses palpated. No hepatosplenomegaly. Bowel sounds positive.    Musculoskeletal: no clubbing / cyanosis. No joint deformity upper and lower extremities. Good ROM, no contractures. Normal muscle tone.  Skin: Bruising  Right posterior forearm /2 fall, no rashes, lesions, ulcers. No induration Neurologic: CN 2-12 grossly intact. Strength 5/5 in all 4.  Psychiatric: Normal judgment and insight. Alert and oriented x 3. Slowed speech, easily distracted, requiring re-directioning, Normal mood.    Labs on Admission: I have personally reviewed following labs and imaging studies  CBC: Recent Labs  Lab 03/25/18 1839  WBC 9.6  NEUTROABS 7.7  HGB 13.5  HCT 42.9  MCV 87.7  PLT 442*   Basic Metabolic Panel: Recent Labs  Lab 03/25/18 1839  NA 137  K 3.5  CL 104  CO2 27  GLUCOSE 98  BUN 14  CREATININE 0.93  CALCIUM 9.1   GFR: CrCl cannot be calculated (Unknown ideal weight.). Liver Function Tests: Recent Labs  Lab 03/25/18 1839  AST 16  ALT 9  ALKPHOS 98  BILITOT 0.7  PROT 7.1  ALBUMIN 3.7   Recent Labs  Lab 03/25/18 1839  LIPASE 32   Recent Labs  Lab 03/25/18 1918  AMMONIA 11   Coagulation Profile: Recent Labs  Lab 03/25/18 1839  INR 1.00   Urine analysis:    Component Value Date/Time   COLORURINE YELLOW 03/25/2018 1840   APPEARANCEUR CLEAR 03/25/2018 1840   LABSPEC 1.021 03/25/2018 1840   PHURINE 6.0 03/25/2018 1840   GLUCOSEU NEGATIVE 03/25/2018 1840   HGBUR NEGATIVE 03/25/2018 1840   BILIRUBINUR NEGATIVE 03/25/2018 1840   KETONESUR NEGATIVE 03/25/2018 1840   PROTEINUR NEGATIVE 03/25/2018 1840   NITRITE NEGATIVE 03/25/2018 1840   LEUKOCYTESUR TRACE (A) 03/25/2018 1840    Radiological Exams on Admission: Dg Chest 2 View  Result Date: 03/25/2018 CLINICAL DATA:  Rectal bleeding EXAM: CHEST - 2 VIEW COMPARISON:  06/15/2017 FINDINGS: Hyperinflation. No focal airspace disease or effusion. Normal heart size. No pneumothorax. Multiple old bilateral rib fractures. Old bilateral clavicle fractures. IMPRESSION: No active  cardiopulmonary disease. Electronically Signed   By: Jasmine Pang M.D.   On: 03/25/2018 19:53   Ct Head Wo Contrast  Result Date: 03/25/2018 CLINICAL DATA:  Altered level of consciousness. EXAM: CT HEAD WITHOUT CONTRAST TECHNIQUE: Contiguous axial images were obtained from the base of the skull through the vertex without intravenous contrast. COMPARISON:  01/20/2018 FINDINGS: Brain: There is atrophy and chronic small vessel disease changes. Left thalamic lacunar infarct, old. No acute intracranial abnormality. Specifically, no hemorrhage, hydrocephalus, mass lesion, acute infarction, or significant intracranial injury. Vascular: No hyperdense vessel or unexpected calcification. Skull: No acute calvarial abnormality. Sinuses/Orbits: Visualized paranasal sinuses and mastoids clear. Orbital soft tissues unremarkable. Other: None IMPRESSION: No acute intracranial abnormality. Atrophy, chronic microvascular disease. Old left thalamic lacunar infarct.  Electronically Signed   By: Charlett Nose M.D.   On: 03/25/2018 20:34    EKG: None.  Assessment/Plan Active Problems:   Rectal bleed   CVA (cerebral vascular accident) (HCC)   Rectal bleed- with reports of black stools. Hgb 13.5, down 3 points from prior 16.6. No abdominal pain.  Stable vitals.  On aspirin 81 mg daily.  Denies NSAID use.  Denies alcohol intake in at least 9 months. -Continue Protonix GTT with bolus started in the ED - CBC Q8H X 3. - GI evaluation in a.m, order placed. - Hold Home aspirin - IVF N/s + 40 KCl X 12 hrs.  CVA-with residual cognitive deficits, with abnormal gait/ataxia.  Head CT shows old left thalamic lacunar infarct.  Per brother patient requires almost 24-hour supervision and patients significant other helps with that. - PT eval.  -Hold aspirin  Tobacco abuse  DVT prophylaxis: Scds Code Status: Full Family Communication: Older brother at bedside Disposition Plan: Per rounding team Consults called: GI  consulted Admission status: Obs, tele   Onnie Boer MD [facility] Pager [phone] From 3PM-11PM.  Otherwise please contact night-coverage [email or web address] Password [facility]  03/25/2018, 10:33 PM

## 2018-03-26 DIAGNOSIS — I639 Cerebral infarction, unspecified: Secondary | ICD-10-CM

## 2018-03-26 DIAGNOSIS — K625 Hemorrhage of anus and rectum: Secondary | ICD-10-CM | POA: Diagnosis not present

## 2018-03-26 DIAGNOSIS — K921 Melena: Secondary | ICD-10-CM | POA: Diagnosis not present

## 2018-03-26 LAB — CBC
HCT: 38.9 % — ABNORMAL LOW (ref 39.0–52.0)
HEMATOCRIT: 38 % — AB (ref 39.0–52.0)
HEMOGLOBIN: 12 g/dL — AB (ref 13.0–17.0)
Hemoglobin: 12.2 g/dL — ABNORMAL LOW (ref 13.0–17.0)
MCH: 28 pg (ref 26.0–34.0)
MCH: 27.6 pg (ref 26.0–34.0)
MCHC: 31.6 g/dL (ref 30.0–36.0)
MCHC: 31.4 g/dL (ref 30.0–36.0)
MCV: 88.6 fL (ref 80.0–100.0)
MCV: 88 fL (ref 80.0–100.0)
NRBC: 0 % (ref 0.0–0.2)
NRBC: 0 % (ref 0.0–0.2)
PLATELETS: 409 10*3/uL — AB (ref 150–400)
PLATELETS: 414 10*3/uL — AB (ref 150–400)
RBC: 4.29 MIL/uL (ref 4.22–5.81)
RBC: 4.42 MIL/uL (ref 4.22–5.81)
RDW: 14.8 % (ref 11.5–15.5)
RDW: 15 % (ref 11.5–15.5)
WBC: 9.2 10*3/uL (ref 4.0–10.5)
WBC: 8.1 10*3/uL (ref 4.0–10.5)

## 2018-03-26 MED ORDER — HALOPERIDOL LACTATE 5 MG/ML IJ SOLN: 1.0000 mg | Freq: Four times a day (QID) | INTRAMUSCULAR | Status: DC | PRN

## 2018-03-26 MED ORDER — PANTOPRAZOLE SODIUM 40 MG IV SOLR
INTRAVENOUS | Status: AC
  Filled 2018-03-26: qty 80

## 2018-03-26 MED ORDER — LORAZEPAM 2 MG/ML IJ SOLN
0.5000 mg | INTRAMUSCULAR | Status: DC | PRN
  Administered 2018-03-26 – 2018-03-27 (×2): 1 mg via INTRAVENOUS
  Filled 2018-03-26 (×2): qty 1

## 2018-03-26 MED ORDER — PANTOPRAZOLE SODIUM 40 MG PO TBEC
40.0000 mg | DELAYED_RELEASE_TABLET | Freq: Two times a day (BID) | ORAL | Status: DC
  Administered 2018-03-26 – 2018-03-27 (×3): 40 mg via ORAL
  Filled 2018-03-26 (×3): qty 1

## 2018-03-26 NOTE — Progress Notes (Signed)
PROGRESS NOTE    Roberto VALLEZ  Fitzpatrick:[id number]  DOB: [birth year]  DOA: 03/25/2018 PCP: Patient, No Pcp Per   Brief Admission Hx: Roberto Fitzpatrick is a 62 y.o. male with medical history significant for CVA, tobacco abuse who presented to the ED with reports of rectal bleeding and or black stools intermittent over the past 6 months and had some day of admission.   MDM/Assessment & Plan:   1. Melena - per history.  Hg 12. He is on daily aspirin for secondary stroke prevention.  GI has seen him and he is on protonix.  He is on clear diet now.  Further workup with GI being discussed.  [facility]  Repeat CBC in AM.  2. Cerebrovascular disease - s/p CVA  -  Staff requested sitter for bedside.  Holding aspirin temporarily.  3. Tobacco use - nicotine patch offered.  4. Fall - PT evaluation pending.   DVT prophylaxis: SCDs Code Status: Full  Family Communication:  Disposition Plan: home tomorrow  Consultants:  GI  Procedures:  TBD   Subjective: Pt without complaints,  He has not had a recurrence of melena so far.  No chest pain.  No SOB.    Objective: Vitals:   03/25/18 2200 03/25/18 2310 03/26/18 0536 03/26/18 0928  BP: (!) 153/87 105/83 (!) 157/84   Pulse: 67 74 70   Resp:  18 18   Temp:  98.6 F (37 C) 98.5 F (36.9 C)   TempSrc:  Oral Oral   SpO2: 96% 95% 97% 93%  Weight:        Intake/Output Summary (Last 24 hours) at 03/26/2018 1207 Last data filed at 03/26/2018 1118 Gross per 24 hour  Intake 2420.38 ml  Output 500 ml  Net 1920.38 ml   Filed Weights   03/25/18 1817  Weight: 68.5 kg   REVIEW OF SYSTEMS  As per history otherwise all reviewed and reported negative  Exam:  General exam: awake, alert, nad, cooperative.  Respiratory system: Clear. No increased work of breathing. Cardiovascular system: S1 & S2 heard, RRR. No JVD, murmurs, gallops, clicks or pedal edema. Gastrointestinal system: Abdomen is  nondistended, soft and nontender. Normal bowel sounds heard. Central nervous system: Alert and oriented. No focal neurological deficits. Extremities: small bruise right forearm, no CCE.  Data Reviewed: Basic Metabolic Panel: Recent Labs  Lab 03/25/18 1839  NA 137  K 3.5  CL 104  CO2 27  GLUCOSE 98  BUN 14  CREATININE 0.93  CALCIUM 9.1   Liver Function Tests: Recent Labs  Lab 03/25/18 1839  AST 16  ALT 9  ALKPHOS 98  BILITOT 0.7  PROT 7.1  ALBUMIN 3.7   Recent Labs  Lab 03/25/18 1839  LIPASE 32   Recent Labs  Lab 03/25/18 1918  AMMONIA 11   CBC: Recent Labs  Lab 03/25/18 1839 03/26/18 0150 03/26/18 0949  WBC 9.6 9.2 8.1  NEUTROABS 7.7  --   --   HGB 13.5 12.0* 12.2*  HCT 42.9 38.0* 38.9*  MCV 87.7 88.6 88.0  PLT 442* 409* 414*   Cardiac Enzymes: No results for input(s): CKTOTAL, CKMB, CKMBINDEX, TROPONINI in the last 168 hours. CBG (last 3)  No results for input(s): GLUCAP in the last 72 hours. No results found for this or any previous visit (from the past 240 hour(s)).   Studies: Dg Chest 2 View  Result Date: 03/25/2018 CLINICAL DATA:  Rectal bleeding EXAM: CHEST -  2 VIEW COMPARISON:  06/15/2017 FINDINGS: Hyperinflation. No focal airspace disease or effusion. Normal heart size. No pneumothorax. Multiple old bilateral rib fractures. Old bilateral clavicle fractures. IMPRESSION: No active cardiopulmonary disease. Electronically Signed   By: Jasmine Pang M.D.   On: 03/25/2018 19:53   Ct Head Wo Contrast  Result Date: 03/25/2018 CLINICAL DATA:  Altered level of consciousness. EXAM: CT HEAD WITHOUT CONTRAST TECHNIQUE: Contiguous axial images were obtained from the base of the skull through the vertex without intravenous contrast. COMPARISON:  01/20/2018 FINDINGS: Brain: There is atrophy and chronic small vessel disease changes. Left thalamic lacunar infarct, old. No acute intracranial abnormality. Specifically, no hemorrhage, hydrocephalus, mass lesion,  acute infarction, or significant intracranial injury. Vascular: No hyperdense vessel or unexpected calcification. Skull: No acute calvarial abnormality. Sinuses/Orbits: Visualized paranasal sinuses and mastoids clear. Orbital soft tissues unremarkable. Other: None IMPRESSION: No acute intracranial abnormality. Atrophy, chronic microvascular disease. Old left thalamic lacunar infarct. Electronically Signed   By: Charlett Nose M.D.   On: 03/25/2018 20:34   Scheduled Meds: . pantoprazole  40 mg Oral BID AC   Continuous Infusions:  Active Problems:   Rectal bleed   CVA (cerebral vascular accident) (HCC)   Melena  Time spent:   Standley Dakins, MD, FAAFP [facility] Pager [phone]  If 7PM-7AM, please contact night-coverage [email or web address] Password [email or web address] 03/26/2018, 12:07 PM    LOS: 0 days

## 2018-03-26 NOTE — Evaluation (Signed)
Physical Therapy Evaluation Patient Details Name: Roberto Fitzpatrick MRN: [id number] DOB: 1956-05-13 Today's Date: 03/26/2018   History of Present Illness  Roberto Fitzpatrick is a 62 y.o. male with medical history significant for CVA, tobacco abuse who presented to the ED with reports of rectal bleeding and or black stools intermittent over the past 6 months, and then today.  Patient is not able to give me a lot of detail but older brother is at bedside and gives most of the history.  Patient tells me he did not look at his stool.  Apparently patient significant order who lives with patient reported the black stools and bleeding per rectum.  He denies vomiting or abdominal pain. Denies NSAID use.    Clinical Impression  Patient functioning near baseline for functional mobility and gait, demonstrates impulsive behavior and requires most assistance for safety purposes.  Patient is unsteady on feet and requires hand held assist and leaning on nearby objects for support, able to support self with IV pole during gait training without loss of balance.  Patient tolerated sitting up in chair with his brother supervising after therapy and instructed to notify nursing staff when is ready to leave patient's room - RN/NT notified.  Patient will benefit from continued physical therapy in hospital and recommended venue below to increase strength, balance, endurance for safe ADLs and gait.    Follow Up Recommendations No PT follow up;Supervision/Assistance - 24 hour;Supervision for mobility/OOB    Equipment Recommendations  None recommended by PT    Recommendations for Other Services       Precautions / Restrictions Precautions Precautions: Fall Restrictions Weight Bearing Restrictions: No      Mobility  Bed Mobility Overal bed mobility: Modified Independent                Transfers Overall transfer level: Needs assistance Equipment used: 1 person hand held assist;None Transfers: Sit to/from  [employer] Sit to Stand: Supervision Stand pivot transfers: Min assist       General transfer comment: very impulsive, most assistance involved in directing patient  Ambulation/Gait Ambulation/Gait assistance: Min assist Gait Distance (Feet): 60 Feet Assistive device: 1 person hand held assist;IV Pole Gait Pattern/deviations: Decreased step length - right;Decreased step length - left;Decreased stride length Gait velocity: decreased   General Gait Details: demonstrates slow labored cadence holding onto IV pole for support, no loss of balance, limited secondary to fatigue, did not attempt using AD due to history of noncompliance  Stairs            Wheelchair Mobility    Modified Rankin (Stroke Patients Only)       Balance Overall balance assessment: Needs assistance Sitting-balance support: Feet supported;No upper extremity supported Sitting balance-Leahy Scale: Good     Standing balance support: During functional activity;No upper extremity supported Standing balance-Leahy Scale: Poor Standing balance comment: fair/poor without AD, fair with hand held assist or using IV pole, leaning on nearby objects                             Pertinent Vitals/Pain Pain Assessment: No/denies pain    Home Living Family/patient expects to be discharged to:: Private residence Living Arrangements: Non-relatives/Friends Available Help at Discharge: Personal care attendant;Available 24 hours/day;Family Type of Home: House Home Access: Stairs to enter Entrance Stairs-Rails: Right;Left;Can reach both Entrance Stairs-Number of Steps: 11-12 Home Layout: One level;Laundry or work area in basement(patient does not go into basement,  keeps it locked per patient's brother) Home Equipment: Dan Humphreys - 2 wheels;Wheelchair - manual      Prior Function Level of Independence: Needs assistance   Gait / Transfers Assistance Needed: Hand held assisted for household  ambulation, has RW, but patient does/will not use per patient's brother  ADL's / Homemaking Assistance Needed: home aides 7 days/week, 24 hours/day        Hand Dominance        Extremity/Trunk Assessment   Upper Extremity Assessment Upper Extremity Assessment: Generalized weakness    Lower Extremity Assessment Lower Extremity Assessment: Generalized weakness    Cervical / Trunk Assessment Cervical / Trunk Assessment: Normal  Communication   Communication: No difficulties;Other (comment)(patient has poor memory for most questions)  Cognition Arousal/Alertness: Awake/alert Behavior During Therapy: Restless;Impulsive Overall Cognitive Status: History of cognitive impairments - at baseline                                 General Comments: able to follow directs with repeated verbal/tactile cueing      General Comments      Exercises     Assessment/Plan    PT Assessment Patient needs continued PT services  PT Problem List Decreased strength;Decreased activity tolerance;Decreased balance;Decreased mobility       PT Treatment Interventions Gait training;Stair training;Functional mobility training;Therapeutic activities;Therapeutic exercise;Patient/family education    PT Goals (Current goals can be found in the Care Plan section)  Acute Rehab PT Goals Patient Stated Goal: return home PT Goal Formulation: With patient/family Time For Goal Achievement: 04/02/18 Potential to Achieve Goals: Good    Frequency Min 3X/week   Barriers to discharge        Co-evaluation               AM-PAC PT "6 Clicks" Daily Activity  Outcome Measure Difficulty turning over in bed (including adjusting bedclothes, sheets and blankets)?: None Difficulty moving from lying on back to sitting on the side of the bed? : None Difficulty sitting down on and standing up from a chair with arms (e.g., wheelchair, bedside commode, etc,.)?: None Help needed moving to and from  a bed to chair (including a wheelchair)?: A Little Help needed walking in hospital room?: A Little Help needed climbing 3-5 steps with a railing? : A Lot 6 Click Score: 20    End of Session Equipment Utilized During Treatment: Gait belt Activity Tolerance: Patient tolerated treatment well;Patient limited by fatigue Patient left: in chair;with call bell/phone within reach;with family/visitor present Nurse Communication: Mobility status;Other (comment)(RN notified that patient left up in chair) PT Visit Diagnosis: Unsteadiness on feet (R26.81);Other abnormalities of gait and mobility (R26.89);Muscle weakness (generalized) (M62.81)    Time: 1610-9604 PT Time Calculation (min) (ACUTE ONLY): 30 min   Charges:   PT Evaluation $PT Eval Moderate Complexity: 1 Mod PT Treatments $Therapeutic Activity: 23-37 mins        2:29 PM, 03/26/18 [name], MPT Physical Therapist with [facility] 336 [phone] office [phone] mobile phone

## 2018-03-26 NOTE — Consult Note (Addendum)
Referring Provider: Cleora Fleet, MD Primary Care Physician:  Patient, No Pcp Per Primary Gastroenterologist:  Roetta Sessions, MD   Reason for Consultation: Rectal bleeding, hemoglobin dropped 3 points  HPI: Roberto Fitzpatrick is a 62 y.o. male with history of stroke (patient states his speech is slow since his stroke), history of tobacco abuse, past alcohol abuse who presented to the emergency department with reported black stools and rectal bleeding.  Apparently has had black stools for about 6 months.  Has been evaluated at the [place] but unclear what has been told or recommended.  Roberto Fitzpatrick who stays with the patient called his brother to let them know that he was having some bright red blood per rectum.  In the ED rectal exam showed heme positive stool.  No reported abdominal pain.  He takes aspirin 81 mg daily since his stroke a few years back.  Since the stroke he has had some memory problems/cognitive deficits, slowed speech, abnormal gait.  According to the records, patient's brother denied patient having any alcohol for the past 9 months since he was in the ED last.  He is never had a EGD and colonoscopy.  He ambulates but falls frequently due to abnormal gait since stroke.   In the ED his hemoglobin was 13.5, 9 months ago was 16.6.  Platelets 442,000.  Urine drug screen positive for THC.  Ethanol level less than 10.  Hemoglobin this morning was 12,000, platelets 409,000.  Head CT with atrophy, chronic microvascular disease, old left thalamic lacunar infarct.  Chest x-ray with multiple old bilateral rib fractures, old bilateral clavicle fractures, hyperinflation but no active cardiopulmonary disease.  Prior to Admission medications   Medication Sig Start Date End Date Taking? Authorizing Provider  aspirin EC 81 MG tablet Take 81 mg by mouth daily.   Yes [provider]  donepezil (ARICEPT) 10 MG tablet Take 5 mg by mouth at bedtime.   Yes [provider]  Meclizine HCl 25 MG  CHEW Chew 25 mg by mouth at bedtime.   Yes [provider]  simvastatin (ZOCOR) 20 MG tablet Take 20 mg by mouth daily.   Yes [provider]  vitamin B-12 (CYANOCOBALAMIN) 500 MCG tablet Take 500 mcg by mouth daily.   Yes [provider]    Current Facility-Administered Medications  Medication Dose Route Frequency Provider Last Rate Last Dose  . 0.9 % NaCl with KCl 40 mEq / L  infusion   Intravenous Continuous Johnson, Clanford L, MD 75 mL/hr at 03/25/18 2350 75 mL/hr at 03/25/18 2350  . ondansetron (ZOFRAN) tablet 4 mg  4 mg Oral Q6H PRN Emokpae, Ejiroghene E, MD       Or  . ondansetron (ZOFRAN) injection 4 mg  4 mg Intravenous Q6H PRN Emokpae, Ejiroghene E, MD      . pantoprazole (PROTONIX) 80 mg in sodium chloride 0.9 % 250 mL (0.32 mg/mL) infusion  8 mg/hr Intravenous Continuous Emokpae, Ejiroghene E, MD 25 mL/hr at 03/26/18 0242 8 mg/hr at 03/26/18 0242    Allergies as of 03/25/2018  . (No Known Allergies)    Past Medical History:  Diagnosis Date  . Hypertension   . Stroke (HCC)   . TIA (transient ischemic attack)     Past Surgical History:  Procedure Laterality Date  . APPENDECTOMY      Family History  Problem Relation Age of Onset  . Hypertension Mother   . Hypertension Brother     Social History   Socioeconomic  History  . Marital status: Married    Spouse name: Not on file  . Number of children: Not on file  . Years of education: Not on file  . Highest education level: Not on file  Occupational History  . Not on file  Social Needs  . Financial resource strain: Not on file  . Food insecurity:    Worry: Not on file    Inability: Not on file  . Transportation needs:    Medical: Not on file    Non-medical: Not on file  Tobacco Use  . Smoking status: Current Some Day Smoker  . Smokeless tobacco: Never Used  Substance and Sexual Activity  . Alcohol use: Yes  . Drug use: No  . Sexual activity: Not on file  Lifestyle  .  Physical activity:    Days per week: Not on file    Minutes per session: Not on file  . Stress: Not on file  Relationships  . Social connections:    Talks on phone: Not on file    Gets together: Not on file    Attends religious service: Not on file    Active member of club or organization: Not on file    Attends meetings of clubs or organizations: Not on file    Relationship status: Not on file  . Intimate partner violence:    Fear of current or ex partner: Not on file    Emotionally abused: Not on file    Physically abused: Not on file    Forced sexual activity: Not on file  Other Topics Concern  . Not on file  Social History Narrative  . Not on file     ROS:  General: Negative for anorexia, weight loss, fever, chills, fatigue, +weakness. Eyes: Negative for vision changes.  ENT: Negative for hoarseness, difficulty swallowing , nasal congestion. CV: Negative for chest pain, angina, palpitations, dyspnea on exertion, peripheral edema.  Respiratory: Negative for dyspnea at rest, dyspnea on exertion, cough, sputum, wheezing.  GI: See history of present illness. GU:  Negative for dysuria, hematuria, urinary incontinence, urinary frequency, nocturnal urination.  MS: Negative for joint pain, low back pain.  Derm: Negative for rash or itching.  Neuro: Negative for   abnormal sensation, seizure, frequent headaches, memory loss, confusion. +weak, falls frequently.slow speech since stroke Psych: Negative for anxiety, depression, suicidal ideation, hallucinations.  Endo: Negative for unusual weight change.  Heme: Negative for bruising or bleeding. Allergy: Negative for rash or hives.       Physical Examination: Vital signs in last 24 hours: Temp:  [98.2 F (36.8 C)-98.6 F (37 C)] 98.5 F (36.9 C) (10/24 0536) Pulse Rate:  [67-76] 70 (10/24 0536) Resp:  [12-18] 18 (10/24 0536) BP: (105-157)/(78-87) 157/84 (10/24 0536) SpO2:  [93 %-98 %] 93 % (10/24 0928) Weight:  [68.5 kg]  68.5 kg (10/23 1817)    General: thin male in no acute distress.  Head: Normocephalic, atraumatic.   Eyes: Conjunctiva pink, no icterus. Mouth: Oropharyngeal mucosa moist and pink , no lesions erythema or exudate. Neck: Supple without thyromegaly, masses, or lymphadenopathy.  Lungs: Clear to auscultation bilaterally.  Heart: Regular rate and rhythm, no murmurs rubs or gallops.  Abdomen: Bowel sounds are normal, nontender, nondistended, no hepatosplenomegaly or masses, no abdominal bruits or    hernia , no rebound or guarding.   Rectal: not performed Extremities: No lower extremity edema, clubbing, deformity.  Neuro: Alert and oriented x 4 , grossly normal neurologically.  Skin: Warm and  dry, no rash or jaundice.   Psych: Alert and cooperative, normal mood and affect.        Intake/Output from previous day: 10/23 0701 - 10/24 0700 In: 1616.2 [I.V.:518.8; IV Piggyback:1097.4] Out: 300 [Urine:300] Intake/Output this shift: No intake/output data recorded.  Lab Results: CBC Recent Labs    03/25/18 1839 03/26/18 0150  WBC 9.6 9.2  HGB 13.5 12.0*  HCT 42.9 38.0*  MCV 87.7 88.6  PLT 442* 409*   BMET Recent Labs    03/25/18 1839  NA 137  K 3.5  CL 104  CO2 27  GLUCOSE 98  BUN 14  CREATININE 0.93  CALCIUM 9.1   LFT Recent Labs    03/25/18 1839  BILITOT 0.7  ALKPHOS 98  AST 16  ALT 9  PROT 7.1  ALBUMIN 3.7    Lipase Recent Labs    03/25/18 1839  LIPASE 32    PT/INR Recent Labs    03/25/18 1839  LABPROT 13.1  INR 1.00      Imaging Studies: Dg Chest 2 View  Result Date: 03/25/2018 CLINICAL DATA:  Rectal bleeding EXAM: CHEST - 2 VIEW COMPARISON:  06/15/2017 FINDINGS: Hyperinflation. No focal airspace disease or effusion. Normal heart size. No pneumothorax. Multiple old bilateral rib fractures. Old bilateral clavicle fractures. IMPRESSION: No active cardiopulmonary disease. Electronically Signed   By: Jasmine Pang M.D.   On: 03/25/2018 19:53   Ct  Head Wo Contrast  Result Date: 03/25/2018 CLINICAL DATA:  Altered level of consciousness. EXAM: CT HEAD WITHOUT CONTRAST TECHNIQUE: Contiguous axial images were obtained from the base of the skull through the vertex without intravenous contrast. COMPARISON:  01/20/2018 FINDINGS: Brain: There is atrophy and chronic small vessel disease changes. Left thalamic lacunar infarct, old. No acute intracranial abnormality. Specifically, no hemorrhage, hydrocephalus, mass lesion, acute infarction, or significant intracranial injury. Vascular: No hyperdense vessel or unexpected calcification. Skull: No acute calvarial abnormality. Sinuses/Orbits: Visualized paranasal sinuses and mastoids clear. Orbital soft tissues unremarkable. Other: None IMPRESSION: No acute intracranial abnormality. Atrophy, chronic microvascular disease. Old left thalamic lacunar infarct. Electronically Signed   By: Charlett Nose M.D.   On: 03/25/2018 20:34  [4 week]   Impression: 62 year old male presenting with reported rectal bleeding, slow to respond.  History provided predominantly by the brother per medical records.  There has been reported melena off and on for 6 months, recent rectal bleeding for which she was brought to the ED.  Has been evaluated at the [place] but no prior EGD or colonoscopy although supposedly in the process of being done.  In the ED he was heme positive.  Hemoglobin on presentation 3 g lower than 9 months ago but still normal.  Some mild decline overnight.  No overt GI bleeding since admission.  Patient denies abdominal pain.  Reports no recent alcohol use although drinks it when he can get it.  Denies any bowel concerns.  We discussed at length possibility of upper endoscopy and colonoscopy and patient is somewhat reluctant.  We discussed pursuing upper endoscopy initially given reported black stool and then pursue colonoscopy later if agreeable but again patient is unsure if he wants to proceed.  He seems to be  competent to make that decision this morning.  We may need to consider evaluation for competency of making the decisions regarding endoscopies however given no overt bleeding noted thus far and relatively stable hemoglobin, consider monitoring further until patient makes a decision.  Plan: 1. PPI BID.  2. Offered colonoscopy and/or  upper endoscopy versus but pursuing upper endoscopy first given ease of exam and reported melena, patient unsure if he wants to pursue.  Discussed with Dr. Jena Gauss.  Return for overt GI bleeding, further decline in hemoglobin.  Consider evaluation to determine competency with decision-making if needed. 3. Clear liquid diet.   We would like to thank you for the opportunity to participate in the care of Roberto Fitzpatrick.  Leanna Battles. Dixon Boos [facility] [phone] 10/24/201910:07 AM     LOS: 0 days     Addendum: f/u CBC stable.  Leanna Battles. Dixon Boos [facility] [phone] 10/24/201912:59 PM

## 2018-03-26 NOTE — Care Management Note (Addendum)
Case Management Note  Patient Details  Name: COTTON BECKLEY MRN: [id number] Date of Birth: [birth year]  Subjective/Objective:      Rectal bleed. From home.  Reports he has 24/7 supervision. Follows at [place]. No recommendations from PT. Declining endoscopy currently.                Action/Plan: DC home.   Expected Discharge Date:     03/27/2018             Expected Discharge Plan:  Home/Self Care  In-House Referral:     Discharge planning Services  CM Consult  Post Acute Care Choice:  NA Choice offered to:  NA  DME Arranged:    DME Agency:     HH Arranged:    HH Agency:     Status of Service:  Completed, signed off  If discussed at [employer] of Stay Meetings, dates discussed:    Additional Comments:  Fiora Weill, Chrystine Oiler, RN 03/26/2018, 10:56 AM

## 2018-03-26 NOTE — Plan of Care (Signed)
  Problem: Acute Rehab PT Goals(only PT should resolve) Goal: Patient Will Transfer Sit To/From Stand Outcome: Progressing Flowsheets (Taken 03/26/2018 1430) Patient will transfer sit to/from stand: with minimal assist Goal: Pt Will Transfer Bed To Chair/Chair To Bed Outcome: Progressing Flowsheets (Taken 03/26/2018 1430) Pt will Transfer Bed to Chair/Chair to Bed: with min assist Goal: Pt Will Ambulate Outcome: Progressing Flowsheets (Taken 03/26/2018 1430) Pt will Ambulate: 75 feet; with minimal assist Note:  Hand held assist   2:31 PM, 03/26/18 [name], MPT Physical Therapist with [facility] [phone] office [phone] mobile phone

## 2018-03-27 ENCOUNTER — Encounter (HOSPITAL_COMMUNITY): Payer: Self-pay | Admitting: Family Medicine

## 2018-03-27 DIAGNOSIS — I639 Cerebral infarction, unspecified: Secondary | ICD-10-CM | POA: Diagnosis not present

## 2018-03-27 DIAGNOSIS — K625 Hemorrhage of anus and rectum: Secondary | ICD-10-CM | POA: Diagnosis not present

## 2018-03-27 DIAGNOSIS — K921 Melena: Secondary | ICD-10-CM | POA: Diagnosis not present

## 2018-03-27 LAB — CBC
HEMATOCRIT: 37.5 % — AB (ref 39.0–52.0)
HEMOGLOBIN: 11.8 g/dL — AB (ref 13.0–17.0)
MCH: 27.7 pg (ref 26.0–34.0)
MCHC: 31.5 g/dL (ref 30.0–36.0)
MCV: 88 fL (ref 80.0–100.0)
Platelets: 410 10*3/uL — ABNORMAL HIGH (ref 150–400)
RBC: 4.26 MIL/uL (ref 4.22–5.81)
RDW: 14.9 % (ref 11.5–15.5)
WBC: 7.7 10*3/uL (ref 4.0–10.5)
nRBC: 0 % (ref 0.0–0.2)

## 2018-03-27 LAB — HIV ANTIBODY (ROUTINE TESTING W REFLEX): HIV SCREEN 4TH GENERATION: NONREACTIVE

## 2018-03-27 MED ORDER — PANTOPRAZOLE SODIUM 40 MG PO TBEC: 40.0000 mg | DELAYED_RELEASE_TABLET | Freq: Two times a day (BID) | ORAL | 0 refills | Status: AC

## 2018-03-27 NOTE — Discharge Instructions (Signed)
Please follow-up with your primary care provider at the [facility] to be referred to a gastroenterologist for further evaluation.  Seek medical care or return to emergency room if symptoms worsen or don't improve.   HIGH FIBER DIET RECOMMENDED  HOLD ALL ASPIRIN UNTIL YOU FOLLOW UP WITH YOUR PRIMARY CARE PROVIDER.    Gastrointestinal Bleeding Gastrointestinal bleeding is bleeding somewhere along the path food travels through the body (digestive tract). This path is anywhere between the mouth and the opening of the butt (anus). You may have blood in your poop (stools) or have black poop. If you throw up (vomit), there may be blood in it. This condition can be mild, serious, or even life-threatening. If you have a lot of bleeding, you may need to stay in the hospital. Follow these instructions at home:  Take over-the-counter and prescription medicines only as told by your doctor.  Eat foods that have a lot of fiber in them. These foods include whole grains, fruits, and vegetables. You can also try eating 1-3 prunes each day.  Drink enough fluid to keep your pee (urine) clear or pale yellow.  Keep all follow-up visits as told by your doctor. This is important. Contact a doctor if:  Your symptoms do not get better. Get help right away if:  Your bleeding gets worse.  You feel dizzy or you pass out (faint).  You feel weak.  You have very bad cramps in your back or belly (abdomen).  You pass large clumps of blood (clots) in your poop.  Your symptoms are getting worse. This information is not intended to replace advice given to you by your health care provider. Make sure you discuss any questions you have with your health care provider. Document Released: 02/27/2008 Document Revised: 10/26/2015 Document Reviewed: 11/07/2014 Elsevier Interactive Patient Education  2018 [employer].   Rectal Bleeding Rectal bleeding is when blood comes out of the opening of the butt (anus).  People with this kind of bleeding may notice bright red blood in their underwear or in the toilet after they poop (have a bowel movement). They may also have dark red or black poop (stool). Rectal bleeding is often a sign that something is wrong. It needs to be checked by a doctor. Follow these instructions at home: Watch for any changes in your condition. Take these actions to help with bleeding and discomfort:  Eat a diet that is high in fiber. This will keep your poop soft so it is easier for you to poop without pushing too hard. Ask your doctor to tell you what foods and drinks are high in fiber.  Drink enough fluid to keep your pee (urine) clear or pale yellow. This also helps keep your poop soft.  Try taking a warm bath. This may help with pain.  Keep all follow-up visits as told by your doctor. This is important.  Get help right away if:  You have new bleeding.  You have more bleeding than before.  You have black or dark red poop.  You throw up (vomit) blood or something that looks like coffee grounds.  You have pain or tenderness in your belly (abdomen).  You have a fever.  You feel weak.  You feel sick to your stomach (nauseous).  You pass out (faint).  You have very bad pain in your butt.  You cannot poop. This information is not intended to replace advice given to you by your health care provider. Make sure you discuss any  questions you have with your health care provider. Document Released: 01/30/2011 Document Revised: 10/26/2015 Document Reviewed: 07/16/2015 Elsevier Interactive Patient Education  [employer].

## 2018-03-27 NOTE — Progress Notes (Signed)
Subjective: Wants to go home. Denies abdomianl pain. No BM yesterday or today per nursing and CNA staff. Tolerating diet well. Appears to be oriented and competent; slow responce. No other GI complaints.  Objective: Vital signs in last 24 hours: Temp:  [98.3 F (36.8 C)-98.5 F (36.9 C)] 98.5 F (36.9 C) (10/24 2219) Pulse Rate:  [70-90] 70 (10/25 0538) Resp:  [18-20] 18 (10/25 0538) BP: (117-165)/(82-92) 117/83 (10/25 0538) SpO2:  [93 %-97 %] 97 % (10/25 0819)   General:   Alert and oriented, pleasant. Slow to respond. Head:  Normocephalic and atraumatic. Eyes:  No icterus, sclera clear. Conjuctiva pink.  Heart:  S1, S2 present, no murmurs noted.  Lungs: Clear to auscultation bilaterally, without wheezing, rales, or rhonchi.  Abdomen:  Bowel sounds present, soft, non-tender, non-distended. No HSM or hernias noted. No rebound or guarding. No masses appreciated  Msk:  Symmetrical without gross deformities. Pulses:  Normal bilateral DP pulses noted. Extremities:  Without clubbing or edema. Neurologic:  Alert and  oriented x4 Skin:  Warm and dry, intact without significant lesions.  Psych:  Alert and cooperative. Normal mood and affect.  Intake/Output from previous day: 10/24 0701 - 10/25 0700 In: 924.2 [P.O.:120; I.V.:804.2] Out: 1450 [Urine:1450] Intake/Output this shift: No intake/output data recorded.  Lab Results: Recent Labs    03/26/18 0150 03/26/18 0949 03/27/18 0611  WBC 9.2 8.1 7.7  HGB 12.0* 12.2* 11.8*  HCT 38.0* 38.9* 37.5*  PLT 409* 414* 410*   BMET Recent Labs    03/25/18 1839  NA 137  K 3.5  CL 104  CO2 27  GLUCOSE 98  BUN 14  CREATININE 0.93  CALCIUM 9.1   LFT Recent Labs    03/25/18 1839  PROT 7.1  ALBUMIN 3.7  AST 16  ALT 9  ALKPHOS 98  BILITOT 0.7   PT/INR Recent Labs    03/25/18 1839  LABPROT 13.1  INR 1.00   Hepatitis Panel No results for input(s): HEPBSAG, HCVAB, HEPAIGM, HEPBIGM in the last 72  hours.   Studies/Results: Dg Chest 2 View  Result Date: 03/25/2018 CLINICAL DATA:  Rectal bleeding EXAM: CHEST - 2 VIEW COMPARISON:  06/15/2017 FINDINGS: Hyperinflation. No focal airspace disease or effusion. Normal heart size. No pneumothorax. Multiple old bilateral rib fractures. Old bilateral clavicle fractures. IMPRESSION: No active cardiopulmonary disease. Electronically Signed   By: Jasmine Pang M.D.   On: 03/25/2018 19:53   Ct Head Wo Contrast  Result Date: 03/25/2018 CLINICAL DATA:  Altered level of consciousness. EXAM: CT HEAD WITHOUT CONTRAST TECHNIQUE: Contiguous axial images were obtained from the base of the skull through the vertex without intravenous contrast. COMPARISON:  01/20/2018 FINDINGS: Brain: There is atrophy and chronic small vessel disease changes. Left thalamic lacunar infarct, old. No acute intracranial abnormality. Specifically, no hemorrhage, hydrocephalus, mass lesion, acute infarction, or significant intracranial injury. Vascular: No hyperdense vessel or unexpected calcification. Skull: No acute calvarial abnormality. Sinuses/Orbits: Visualized paranasal sinuses and mastoids clear. Orbital soft tissues unremarkable. Other: None IMPRESSION: No acute intracranial abnormality. Atrophy, chronic microvascular disease. Old left thalamic lacunar infarct. Electronically Signed   By: Charlett Nose M.D.   On: 03/25/2018 20:34    Assessment: 62 year old male presenting with reported rectal bleeding, slow to respond.  History provided predominantly by the brother per medical records.  There has been reported melena off and on for 6 months, recent rectal bleeding for which she was brought to the ED.  Has been evaluated at the [place] but no  prior EGD or colonoscopy although supposedly in the process of being done.  In the ED he was heme positive.  Hemoglobin on presentation 3 g lower than 9 months ago but still normal.  Some mild decline overnight.  No overt GI bleeding since  admission.  Since admission the patient has denied overt GI symptoms.  Endoscopic evaluation was recommended to him and he declined.  His verbal response was quite slow but he seems to be mentating well and to be competent to make his own decisions.  Today he remained stable.  Mild drift in hemoglobin from 12.2-11.8 likely hydration effect given other cell count drifts.  Plan: 1. Anticipate discharge in the next 24 to 48 hours 2. [facility] and endoscopic evaluation 3. Supportive measures   Thank you for allowing [place] to participate in the care of Roberto Fitzpatrick  Eric Gill, DNP, AGNP-C Adult & Gerontological Nurse Practitioner [facility]     LOS: 0 days    03/27/2018, 8:27 AM

## 2018-03-27 NOTE — Discharge Summary (Signed)
Physician Discharge Summary  Roberto Fitzpatrick:[id number] DOB: [birth year] DOA: 03/25/2018  PCP: [facility]   Admit date: 03/25/2018 Discharge date: 03/27/2018  Admitted From: Home  Disposition: Home  Recommendations for Outpatient Follow-up:  1. Follow up with PCP in 1 weeks 2. Outpatient referral to GI to consider outpatient EGD 3. Please obtain BMP/CBC in one week to follow up Hg.   Discharge Condition: STABLE   CODE STATUS: FULL    Brief Hospitalization Summary: Please see all hospital notes, images, labs for full details of the hospitalization.  HPI: Roberto Fitzpatrick is a 62 y.o. male with medical history significant for CVA, tobacco abuse who presented to the ED with reports of rectal bleeding and or black stools intermittent over the past 6 months, and then today.  Patient is not able to give me a lot of detail but older brother is at bedside and gives most of the history.  Patient tells me he did not look at his stool.  Apparently patient significant order who lives with patient reported the black stools and bleeding per rectum.  He denies vomiting or abdominal pain. Denies NSAID use.  Patient is on daily aspirin 81 mg.  He had a CVA about 3 years ago.  And since then he has had memory problems/cognitive deficits, with slowed speech, abnormal gait.  Per patient and his brother patient is at bedside and is not confused.  Patient's brother denies any alcohol intake, since last ED visit 9 months ago when blood alcohol level was 33. PAtient was supposed to have GI evaluation at the [place] but this has not been done.  And has never had a colonoscopy or EGD. Brother reports and falls a lot sustaining bruises on his extremities, to his abnormal gait from CVA, but he refuses to use assistive devices to ambulate.  ED Course: Stable vitals blood pressure systolic greater than 134.  Hemoglobin down to 13.5 from baseline ~ 16, normal platelets 442. negative blood alcohol level.  Head CT-old  left thalamic lacunar infarct.  Two-view chest x-ray negative for acute abnormality.  FOBT pending. Patient and was started on Protonix GTT in the ED hospitalist was called to admit for GI bleed.  Brief Admission Hx: Roberto Fitzpatrick a 62 y.o.malewith medical history significantfor CVA,tobacco abuse who presented to the ED with reports of rectal bleeding and or black stools intermittent over the past 6 months and had some day of admission.  MDM/Assessment & Plan:   1. Rectal bleeding / Melena - per history.  Hg stable at around 12. He is on daily aspirin for secondary stroke prevention.  HOLD aspirin until he follows up with his PCP for recheck.  Continue protonix 40 mg BID until he follows up with PCP.  GI has seen him and he was offered EGD but patient has consistently declined.   Pt remained stable.  He is willing to follow up and [facility] and to have a GI referral done through them.  2. Cerebrovascular disease - s/p CVA  - Holding aspirin temporarily while he is being worked up for GI bleeding.  Pt advised to discuss restarting aspirin with his PCP and outpatient GI specialist.  He declined further inpatient work up for GI bleeding.    3. Tobacco use - nicotine patch offered while in hospital.  4. Fall - PT evaluation recommended no further PT follow up.   DVT prophylaxis: SCDs Code Status: Full  Family Communication:  Disposition  Plan: home  Consultants:  GI  Discharge Diagnoses:  Active Problems:   Rectal bleed   CVA (cerebral vascular accident) [facility])   Melena  Discharge Instructions: Discharge Instructions    Call MD for:  difficulty breathing, headache or visual disturbances   Complete by:  As directed    Call MD for:  extreme fatigue   Complete by:  As directed    Call MD for:  persistant dizziness or light-headedness   Complete by:  As directed    Increase activity slowly   Complete by:  As directed      Allergies as  of 03/27/2018   No Known Allergies     Medication List    STOP taking these medications   aspirin EC 81 MG tablet     TAKE these medications   donepezil 10 MG tablet Commonly known as:  ARICEPT Take 5 mg by mouth at bedtime.   Meclizine HCl 25 MG Chew Chew 25 mg by mouth at bedtime.   pantoprazole 40 MG tablet Commonly known as:  PROTONIX Take 1 tablet (40 mg total) by mouth 2 (two) times daily before a meal.   simvastatin 20 MG tablet Commonly known as:  ZOCOR Take 20 mg by mouth daily.   vitamin B-12 500 MCG tablet Commonly known as:  CYANOCOBALAMIN Take 500 mcg by mouth daily.      Follow-up Information    [facility] Primary Care Provider. Schedule an appointment as soon as possible for a visit in 1 week(s).   Why:  Hospital Follow Up          No Known Allergies Allergies as of 03/27/2018   No Known Allergies     Medication List    STOP taking these medications   aspirin EC 81 MG tablet     TAKE these medications   donepezil 10 MG tablet Commonly known as:  ARICEPT Take 5 mg by mouth at bedtime.   Meclizine HCl 25 MG Chew Chew 25 mg by mouth at bedtime.   pantoprazole 40 MG tablet Commonly known as:  PROTONIX Take 1 tablet (40 mg total) by mouth 2 (two) times daily before a meal.   simvastatin 20 MG tablet Commonly known as:  ZOCOR Take 20 mg by mouth daily.   vitamin B-12 500 MCG tablet Commonly known as:  CYANOCOBALAMIN Take 500 mcg by mouth daily.       Procedures/Studies: Dg Chest 2 View  Result Date: 03/25/2018 CLINICAL DATA:  Rectal bleeding EXAM: CHEST - 2 VIEW COMPARISON:  06/15/2017 FINDINGS: Hyperinflation. No focal airspace disease or effusion. Normal heart size. No pneumothorax. Multiple old bilateral rib fractures. Old bilateral clavicle fractures. IMPRESSION: No active cardiopulmonary disease. Electronically Signed   By: Jasmine Pang M.D.   On: 03/25/2018 19:53   Ct Head Wo Contrast  Result Date:  03/25/2018 CLINICAL DATA:  Altered level of consciousness. EXAM: CT HEAD WITHOUT CONTRAST TECHNIQUE: Contiguous axial images were obtained from the base of the skull through the vertex without intravenous contrast. COMPARISON:  01/20/2018 FINDINGS: Brain: There is atrophy and chronic small vessel disease changes. Left thalamic lacunar infarct, old. No acute intracranial abnormality. Specifically, no hemorrhage, hydrocephalus, mass lesion, acute infarction, or significant intracranial injury. Vascular: No hyperdense vessel or unexpected calcification. Skull: No acute calvarial abnormality. Sinuses/Orbits: Visualized paranasal sinuses and mastoids clear. Orbital soft tissues unremarkable. Other: None IMPRESSION: No acute intracranial abnormality. Atrophy, chronic microvascular disease. Old left thalamic lacunar infarct. Electronically Signed   By: Caryn Bee  [place] M.D.   On: 03/25/2018 20:34      Subjective: Pt without complaints.  He didn't sleep well.  He wants to go home.  He declines to have EGD done in hospital.    Discharge Exam: Vitals:   03/27/18 0538 03/27/18 0819  BP: 117/83   Pulse: 70   Resp: 18   Temp:    SpO2:  97%   Vitals:   03/26/18 1433 03/26/18 2219 03/27/18 0538 03/27/18 0819  BP: (!) 165/92 (!) 141/82 117/83   Pulse: 78 90 70   Resp: 20 20 18    Temp: 98.3 F (36.8 C) 98.5 F (36.9 C)    TempSrc:  Oral    SpO2: 97% 96%  97%  Weight:        General exam: awake, alert, nad, cooperative.  Respiratory system: Clear. No increased work of breathing. Cardiovascular system: S1 & S2 heard, RRR. No JVD, murmurs, gallops, clicks or pedal edema. Gastrointestinal system: Abdomen is nondistended, soft and nontender. Normal bowel sounds heard. Central nervous system: Alert and oriented. No focal neurological deficits. Extremities: small bruise right forearm, no CCE.  The results of significant diagnostics from this hospitalization (including imaging, microbiology, ancillary and  laboratory) are listed below for reference.     Microbiology: No results found for this or any previous visit (from the past 240 hour(s)).   Labs: BNP (last 3 results) No results for input(s): BNP in the last 8760 hours. Basic Metabolic Panel: Recent Labs  Lab 03/25/18 1839  NA 137  K 3.5  CL 104  CO2 27  GLUCOSE 98  BUN 14  CREATININE 0.93  CALCIUM 9.1   Liver Function Tests: Recent Labs  Lab 03/25/18 1839  AST 16  ALT 9  ALKPHOS 98  BILITOT 0.7  PROT 7.1  ALBUMIN 3.7   Recent Labs  Lab 03/25/18 1839  LIPASE 32   Recent Labs  Lab 03/25/18 1918  AMMONIA 11   CBC: Recent Labs  Lab 03/25/18 1839 03/26/18 0150 03/26/18 0949 03/27/18 0611  WBC 9.6 9.2 8.1 7.7  NEUTROABS 7.7  --   --   --   HGB 13.5 12.0* 12.2* 11.8*  HCT 42.9 38.0* 38.9* 37.5*  MCV 87.7 88.6 88.0 88.0  PLT 442* 409* 414* 410*   Cardiac Enzymes: No results for input(s): CKTOTAL, CKMB, CKMBINDEX, TROPONINI in the last 168 hours. BNP: Invalid input(s): POCBNP CBG: No results for input(s): GLUCAP in the last 168 hours. D-Dimer No results for input(s): DDIMER in the last 72 hours. Hgb A1c No results for input(s): HGBA1C in the last 72 hours. Lipid Profile No results for input(s): CHOL, HDL, LDLCALC, TRIG, CHOLHDL, LDLDIRECT in the last 72 hours. Thyroid function studies No results for input(s): TSH, T4TOTAL, T3FREE, THYROIDAB in the last 72 hours.  Invalid input(s): FREET3 Anemia work up No results for input(s): VITAMINB12, FOLATE, FERRITIN, TIBC, IRON, RETICCTPCT in the last 72 hours. Urinalysis    Component Value Date/Time   COLORURINE YELLOW 03/25/2018 1840   APPEARANCEUR CLEAR 03/25/2018 1840   LABSPEC 1.021 03/25/2018 1840   PHURINE 6.0 03/25/2018 1840   GLUCOSEU NEGATIVE 03/25/2018 1840   HGBUR NEGATIVE 03/25/2018 1840   BILIRUBINUR NEGATIVE 03/25/2018 1840   KETONESUR NEGATIVE 03/25/2018 1840   PROTEINUR NEGATIVE 03/25/2018 1840   NITRITE NEGATIVE 03/25/2018 1840    LEUKOCYTESUR TRACE (A) 03/25/2018 1840   Sepsis Labs Invalid input(s): PROCALCITONIN,  WBC,  LACTICIDVEN Microbiology No results found for this or any previous visit (from the past  240 hour(s)).  Time coordinating discharge:   SIGNED:  Standley Dakins, MD  [facility] 03/27/2018, 1:27 PM Pager [phone]  If 7PM-7AM, please contact night-coverage [email or web address] Password [email or web address]

## 2018-03-27 NOTE — Progress Notes (Signed)
IV's removed, WNL. D/C instructions explained to pt. Verbalized understanding. Pt brother to transport patient home.

## 2018-04-13 ENCOUNTER — Other Ambulatory Visit: Payer: Self-pay

## 2018-04-13 ENCOUNTER — Emergency Department (HOSPITAL_COMMUNITY): Payer: No Typology Code available for payment source

## 2018-04-13 ENCOUNTER — Inpatient Hospital Stay (HOSPITAL_COMMUNITY)
Admission: EM | Admit: 2018-04-13 | Discharge: 2018-04-17 | DRG: 543 | Disposition: A | Discharge status: Home / Self Care | Payer: No Typology Code available for payment source | Attending: Family Medicine | Admitting: Family Medicine

## 2018-04-13 ENCOUNTER — Encounter (HOSPITAL_COMMUNITY): Payer: Self-pay | Admitting: *Deleted

## 2018-04-13 DIAGNOSIS — Z79891 Long term (current) use of opiate analgesic: Secondary | ICD-10-CM

## 2018-04-13 DIAGNOSIS — F0281 Dementia in other diseases classified elsewhere with behavioral disturbance: Secondary | ICD-10-CM | POA: Diagnosis present

## 2018-04-13 DIAGNOSIS — S0181XA Laceration without foreign body of other part of head, initial encounter: Secondary | ICD-10-CM | POA: Diagnosis present

## 2018-04-13 DIAGNOSIS — F0151 Vascular dementia with behavioral disturbance: Secondary | ICD-10-CM | POA: Diagnosis present

## 2018-04-13 DIAGNOSIS — K625 Hemorrhage of anus and rectum: Secondary | ICD-10-CM | POA: Diagnosis present

## 2018-04-13 DIAGNOSIS — I69359 Hemiplegia and hemiparesis following cerebral infarction affecting unspecified side: Secondary | ICD-10-CM

## 2018-04-13 DIAGNOSIS — D72829 Elevated white blood cell count, unspecified: Secondary | ICD-10-CM | POA: Diagnosis present

## 2018-04-13 DIAGNOSIS — M4856XA Collapsed vertebra, not elsewhere classified, lumbar region, initial encounter for fracture: Principal | ICD-10-CM | POA: Diagnosis present

## 2018-04-13 DIAGNOSIS — S01111A Laceration without foreign body of right eyelid and periocular area, initial encounter: Secondary | ICD-10-CM

## 2018-04-13 DIAGNOSIS — F01518 Vascular dementia, unspecified severity, with other behavioral disturbance: Secondary | ICD-10-CM | POA: Diagnosis present

## 2018-04-13 DIAGNOSIS — S34109A Unspecified injury to unspecified level of lumbar spinal cord, initial encounter: Secondary | ICD-10-CM

## 2018-04-13 DIAGNOSIS — Z23 Encounter for immunization: Secondary | ICD-10-CM

## 2018-04-13 DIAGNOSIS — Y92009 Unspecified place in unspecified non-institutional (private) residence as the place of occurrence of the external cause: Secondary | ICD-10-CM

## 2018-04-13 DIAGNOSIS — E876 Hypokalemia: Secondary | ICD-10-CM | POA: Diagnosis present

## 2018-04-13 DIAGNOSIS — J069 Acute upper respiratory infection, unspecified: Secondary | ICD-10-CM | POA: Diagnosis present

## 2018-04-13 DIAGNOSIS — F17200 Nicotine dependence, unspecified, uncomplicated: Secondary | ICD-10-CM | POA: Diagnosis present

## 2018-04-13 DIAGNOSIS — Z8249 Family history of ischemic heart disease and other diseases of the circulatory system: Secondary | ICD-10-CM

## 2018-04-13 DIAGNOSIS — Z79899 Other long term (current) drug therapy: Secondary | ICD-10-CM

## 2018-04-13 DIAGNOSIS — K921 Melena: Secondary | ICD-10-CM | POA: Diagnosis present

## 2018-04-13 DIAGNOSIS — I739 Peripheral vascular disease, unspecified: Secondary | ICD-10-CM

## 2018-04-13 DIAGNOSIS — R52 Pain, unspecified: Secondary | ICD-10-CM

## 2018-04-13 DIAGNOSIS — W06XXXA Fall from bed, initial encounter: Secondary | ICD-10-CM

## 2018-04-13 DIAGNOSIS — I779 Disorder of arteries and arterioles, unspecified: Secondary | ICD-10-CM

## 2018-04-13 DIAGNOSIS — I6523 Occlusion and stenosis of bilateral carotid arteries: Secondary | ICD-10-CM | POA: Diagnosis present

## 2018-04-13 DIAGNOSIS — S32010A Wedge compression fracture of first lumbar vertebra, initial encounter for closed fracture: Secondary | ICD-10-CM | POA: Diagnosis present

## 2018-04-13 DIAGNOSIS — I10 Essential (primary) hypertension: Secondary | ICD-10-CM | POA: Diagnosis present

## 2018-04-13 DIAGNOSIS — R296 Repeated falls: Secondary | ICD-10-CM

## 2018-04-13 DIAGNOSIS — J209 Acute bronchitis, unspecified: Secondary | ICD-10-CM | POA: Diagnosis present

## 2018-04-13 DIAGNOSIS — R059 Cough, unspecified: Secondary | ICD-10-CM

## 2018-04-13 DIAGNOSIS — R05 Cough: Secondary | ICD-10-CM

## 2018-04-13 DIAGNOSIS — S32008A Other fracture of unspecified lumbar vertebra, initial encounter for closed fracture: Secondary | ICD-10-CM

## 2018-04-13 DIAGNOSIS — W19XXXA Unspecified fall, initial encounter: Secondary | ICD-10-CM

## 2018-04-13 LAB — COMPREHENSIVE METABOLIC PANEL
ALK PHOS: 102 U/L (ref 38–126)
ALT: 11 U/L (ref 0–44)
AST: 19 U/L (ref 15–41)
Albumin: 3.1 g/dL — ABNORMAL LOW (ref 3.5–5.0)
Anion gap: 8 (ref 5–15)
BUN: 14 mg/dL (ref 8–23)
CALCIUM: 8.1 mg/dL — AB (ref 8.9–10.3)
CO2: 25 mmol/L (ref 22–32)
CREATININE: 0.81 mg/dL (ref 0.61–1.24)
Chloride: 102 mmol/L (ref 98–111)
Glucose, Bld: 103 mg/dL — ABNORMAL HIGH (ref 70–99)
Potassium: 3.1 mmol/L — ABNORMAL LOW (ref 3.5–5.1)
Sodium: 135 mmol/L (ref 135–145)
TOTAL PROTEIN: 6.8 g/dL (ref 6.5–8.1)
Total Bilirubin: 0.7 mg/dL (ref 0.3–1.2)

## 2018-04-13 LAB — CBG MONITORING, ED: Glucose-Capillary: 98 mg/dL (ref 70–99)

## 2018-04-13 LAB — CBC
HCT: 38.4 % — ABNORMAL LOW (ref 39.0–52.0)
Hemoglobin: 12.1 g/dL — ABNORMAL LOW (ref 13.0–17.0)
MCH: 28 pg (ref 26.0–34.0)
MCHC: 31.5 g/dL (ref 30.0–36.0)
MCV: 88.9 fL (ref 80.0–100.0)
NRBC: 0 % (ref 0.0–0.2)
PLATELETS: 375 10*3/uL (ref 150–400)
RBC: 4.32 MIL/uL (ref 4.22–5.81)
RDW: 14.3 % (ref 11.5–15.5)
WBC: 12.3 10*3/uL — AB (ref 4.0–10.5)

## 2018-04-13 MED ORDER — LIDOCAINE-EPINEPHRINE (PF) 2 %-1:200000 IJ SOLN
10.0000 mL | Freq: Once | INTRAMUSCULAR | Status: AC
  Administered 2018-04-14: 10 mL
  Filled 2018-04-13: qty 20

## 2018-04-13 MED ORDER — TETANUS-DIPHTH-ACELL PERTUSSIS 5-2.5-18.5 LF-MCG/0.5 IM SUSP
0.5000 mL | Freq: Once | INTRAMUSCULAR | Status: AC
  Administered 2018-04-14: 0.5 mL via INTRAMUSCULAR
  Filled 2018-04-13: qty 0.5

## 2018-04-13 NOTE — ED Triage Notes (Signed)
Pt brought in by [facility] for c/o fall; pt fell out of bed; pt has laceration to right eyebrow; pt c/o neck and back pain; caretaker told ems pt does not sound like he normally does, speech wise; pt only able to give name and dob

## 2018-04-13 NOTE — ED Provider Notes (Signed)
[facility] EMERGENCY DEPARTMENT Provider Note   CSN: [id number] Arrival date & time: 04/13/18  2214   Time seen 23:06 PM  History   Chief Complaint Chief Complaint  Patient presents with  . Laceration   Level 5 caveat for dementia  HPI Roberto Fitzpatrick is a 62 y.o. male.  HPI patient states he has fallen about 5 times today.  He states it is because he has a stroke and he is weak.  He denies any localizing weakness.  He complains of pain in the upper and middle back.  He denies neck pain to me but he did complain of neck pain either to EMS or the nursing staff.  Patient speaks slowly and deliberately which was described at his last ED visit on October 23.  Unknown tetanus status.  PCP Patient, No Pcp Per   Past Medical History:  Diagnosis Date  . Hypertension   . Stroke (HCC)   . TIA (transient ischemic attack)     Patient Active Problem List   Diagnosis Date Noted  . Melena   . Rectal bleed 03/25/2018  . CVA (cerebral vascular accident) (HCC) 03/25/2018    Past Surgical History:  Procedure Laterality Date  . APPENDECTOMY          Home Medications    Prior to Admission medications   Medication Sig Start Date End Date Taking? Authorizing Provider  donepezil (ARICEPT) 10 MG tablet Take 5 mg by mouth at bedtime.    [provider]  HYDROcodone-acetaminophen (NORCO/VICODIN) 5-325 MG tablet Take 1 tablet by mouth every 6 (six) hours as needed for severe pain. 04/14/18   Devoria Albe, MD  Meclizine HCl 25 MG CHEW Chew 25 mg by mouth at bedtime.    [provider]  pantoprazole (PROTONIX) 40 MG tablet Take 1 tablet (40 mg total) by mouth 2 (two) times daily before a meal. 03/27/18 04/26/18  Johnson, Clanford L, MD  simvastatin (ZOCOR) 20 MG tablet Take 20 mg by mouth daily.    [provider]  vitamin B-12 (CYANOCOBALAMIN) 500 MCG tablet Take 500 mcg by mouth daily.    [provider]    Family History Family History  Problem  Relation Age of Onset  . Hypertension Mother   . Hypertension Brother     Social History Social History   Tobacco Use  . Smoking status: Current Some Day Smoker  . Smokeless tobacco: Never Used  Substance Use Topics  . Alcohol use: Yes  . Drug use: No  Patient states he lives at home Patient states he uses a walker   Allergies   Patient has no known allergies.   Review of Systems Review of Systems  Unable to perform ROS: Dementia     Physical Exam Updated Vital Signs BP 123/76   Pulse 89   Temp 98.7 F (37.1 C) (Oral)   Resp (!) 28   Ht 5\' 7"  (1.702 m)   Wt 68.5 kg   SpO2 93%   BMI 23.65 kg/m   Physical Exam  Constitutional:  Non-toxic appearance. He does not appear ill. No distress.  Frail elderly male  HENT:  Head: Normocephalic.  Right Ear: External ear normal.  Left Ear: External ear normal.  Nose: Nose normal. No mucosal edema or rhinorrhea.  Mouth/Throat: Oropharynx is clear and moist and mucous membranes are normal. No dental abscesses or uvula swelling.  Patient has a 4 cm laceration in his right eyebrow that is through the dermis.  Eyes:  Pupils are equal, round, and reactive to light. Conjunctivae and EOM are normal.  Neck: Full passive range of motion without pain.  Cervical collar is in place  Cardiovascular: Normal rate, regular rhythm and normal heart sounds. Exam reveals no gallop and no friction rub.  No murmur heard. Pulmonary/Chest: Effort normal. No respiratory distress. He has no wheezes. He has rhonchi. He has no rales. He exhibits no tenderness and no crepitus.  Patient is noted to have diffuse rhonchi however he denies shortness of breath  Abdominal: Soft. Normal appearance and bowel sounds are normal. He exhibits no distension. There is no tenderness. There is no rebound and no guarding.  Musculoskeletal: Normal range of motion. He exhibits no edema or tenderness.  Moves all extremities well.  On my exam he seems painful in the lumbar  spine.  Neurological: He is alert. He has normal strength. No cranial nerve deficit.  Cooperative, he does follow commands such as gripping my fingers.  Skin: Skin is warm, dry and intact. No rash noted. No erythema. No pallor.  Psychiatric: He has a normal mood and affect. His speech is normal and behavior is normal. His mood appears not anxious.  Nursing note and vitals reviewed.      ED Treatments / Results  Labs (all labs ordered are listed, but only abnormal results are displayed) Results for orders placed or performed during the hospital encounter of 04/13/18  Comprehensive metabolic panel  Result Value Ref Range   Sodium 135 135 - 145 mmol/L   Potassium 3.1 (L) 3.5 - 5.1 mmol/L   Chloride 102 98 - 111 mmol/L   CO2 25 22 - 32 mmol/L   Glucose, Bld 103 (H) 70 - 99 mg/dL   BUN 14 8 - 23 mg/dL   Creatinine, Ser 1.61 0.61 - 1.24 mg/dL   Calcium 8.1 (L) 8.9 - 10.3 mg/dL   Total Protein 6.8 6.5 - 8.1 g/dL   Albumin 3.1 (L) 3.5 - 5.0 g/dL   AST 19 15 - 41 U/L   ALT 11 0 - 44 U/L   Alkaline Phosphatase 102 38 - 126 U/L   Total Bilirubin 0.7 0.3 - 1.2 mg/dL   GFR calc non Af Amer >60 >60 mL/min   GFR calc Af Amer >60 >60 mL/min   Anion gap 8 5 - 15  CBC  Result Value Ref Range   WBC 12.3 (H) 4.0 - 10.5 K/uL   RBC 4.32 4.22 - 5.81 MIL/uL   Hemoglobin 12.1 (L) 13.0 - 17.0 g/dL   HCT 09.6 (L) 04.5 - 40.9 %   MCV 88.9 80.0 - 100.0 fL   MCH 28.0 26.0 - 34.0 pg   MCHC 31.5 30.0 - 36.0 g/dL   RDW 81.1 91.4 - 78.2 %   Platelets 375 150 - 400 K/uL   nRBC 0.0 0.0 - 0.2 %  CBG monitoring, ED  Result Value Ref Range   Glucose-Capillary 98 70 - 99 mg/dL   Comment 1 Notify RN    Comment 2 Document in Chart    Laboratory interpretation all normal except hypo-kalemia, leukocytosis    EKG EKG Interpretation  Date/Time:  Monday April 13 2018 22:38:46 EST Ventricular Rate:  81 PR Interval:    QRS Duration: 93 QT Interval:  399 QTC Calculation: 464 R Axis:   77 Text  Interpretation:  Sinus rhythm since last tracing no significant change Confirmed by Mancel Bale [phone]) on 04/13/2018 10:54:28 PM   Radiology Dg Chest 1 View  Result  Date: 04/14/2018 CLINICAL DATA:  Falling confusion. EXAM: CHEST  1 VIEW COMPARISON:  March 25, 2018 FINDINGS: Healed bilateral rib fractures. The heart, hila, mediastinum, lungs, and pleura are otherwise unremarkable. IMPRESSION: No active disease. Electronically Signed   By: Gerome Sam III M.D   On: 04/14/2018 02:57   Dg Thoracic Spine 2 View  Result Date: 04/14/2018 CLINICAL DATA:  Back pain after fall. EXAM: THORACIC SPINE 2 VIEWS COMPARISON:  Chest x-ray June 15, 2017 FINDINGS: The T7 compression fracture is stable. And L1 compression fracture was not definitely seen previously. However, the previous studies were limited due to level penetration. No other fractures. IMPRESSION: 1. There is a compression fracture of L1 which was not seen previously and may be acute. Recommend clinical correlation. A CT scan or MRI could further assess the L1 level if clinically warranted. 2. Stable T7 compression fraction. Electronically Signed   By: Gerome Sam III M.D   On: 04/14/2018 00:35   Dg Lumbar Spine Complete  Result Date: 04/14/2018 CLINICAL DATA:  Pain after fall EXAM: LUMBAR SPINE - COMPLETE 4+ VIEW COMPARISON:  None. FINDINGS: There is a compression fracture of L1 with approximately 30% loss of anterior height. No other fractures or malalignment. Mild multilevel degenerative disc disease. IMPRESSION: 1. There is a compression fracture of L1 with approximately 30% loss of anterior height. This region was not well evaluated on previous chest x-rays but was not definitely seen. An acute L1 compression fracture is not excluded. Electronically Signed   By: Gerome Sam III M.D   On: 04/14/2018 00:36   Ct Head Wo Contrast  Result Date: 04/13/2018 CLINICAL DATA:  Status post fall out of bed, with laceration at the  right eyebrow. Neck pain. Initial encounter. EXAM: CT HEAD WITHOUT CONTRAST CT CERVICAL SPINE WITHOUT CONTRAST TECHNIQUE: Multidetector CT imaging of the head and cervical spine was performed following the standard protocol without intravenous contrast. Multiplanar CT image reconstructions of the cervical spine were also generated. COMPARISON:  CT of the head performed 03/25/2018, and CT of the cervical spine performed 06/15/2017 FINDINGS: CT HEAD FINDINGS Brain: No evidence of acute infarction, hemorrhage, hydrocephalus, extra-axial collection or mass lesion / mass effect. Prominence of the ventricles and sulci reflects mild cortical volume loss. Diffuse periventricular subcortical white matter change likely reflects small vessel ischemic microangiopathy. Chronic ischemic change is noted at the basal ganglia bilaterally, and at the left thalamus. The brainstem and fourth ventricle are within normal limits. The cerebral hemispheres demonstrate grossly normal gray-white differentiation. No mass effect or midline shift is seen. Vascular: No hyperdense vessel or unexpected calcification. Skull: There is no evidence of fracture; visualized osseous structures are unremarkable in appearance. Sinuses/Orbits: The visualized portions of the orbits are within normal limits. The paranasal sinuses and mastoid air cells are well-aerated. Other: No significant soft tissue abnormalities are seen. CT CERVICAL SPINE FINDINGS Alignment: Normal. Skull base and vertebrae: No acute fracture. No primary bone lesion or focal pathologic process. There appears to be a chronic incompletely healed fracture at the right posterior first rib. Soft tissues and spinal canal: No prevertebral fluid or swelling. No visible canal hematoma. Disc levels: Mild intervertebral disc space narrowing is noted at C5-C6, with anterior and posterior disc osteophyte complexes. Mild degenerative change is noted about the dens. Upper chest: Mild vague nonspecific  hypodensity is noted at the left thyroid lobe. The visualized portions of the thyroid gland are otherwise unremarkable. The visualized lung apices are clear. Mild calcification is noted at the  carotid bifurcations bilaterally. Other: No additional soft tissue abnormalities are seen. IMPRESSION: 1. No evidence of traumatic intracranial injury or fracture. 2. No evidence of fracture or subluxation along the cervical spine. 3. Mild cortical volume loss and diffuse small vessel ischemic microangiopathy. 4. Chronic ischemic change at the basal ganglia bilaterally, and at the left thalamus. 5. Mild degenerative change at the lower cervical spine. 6. Mild calcification at the carotid bifurcations bilaterally. Carotid ultrasound could be considered for further evaluation, when and as deemed clinically appropriate. Electronically Signed   By: Roanna Raider M.D.   On: 04/13/2018 23:59   Ct Cervical Spine Wo Contrast  Result Date: 04/13/2018 CLINICAL DATA:  Status post fall out of bed, with laceration at the right eyebrow. Neck pain. Initial encounter. EXAM: CT HEAD WITHOUT CONTRAST CT CERVICAL SPINE WITHOUT CONTRAST TECHNIQUE: Multidetector CT imaging of the head and cervical spine was performed following the standard protocol without intravenous contrast. Multiplanar CT image reconstructions of the cervical spine were also generated. COMPARISON:  CT of the head performed 03/25/2018, and CT of the cervical spine performed 06/15/2017 FINDINGS: CT HEAD FINDINGS Brain: No evidence of acute infarction, hemorrhage, hydrocephalus, extra-axial collection or mass lesion / mass effect. Prominence of the ventricles and sulci reflects mild cortical volume loss. Diffuse periventricular subcortical white matter change likely reflects small vessel ischemic microangiopathy. Chronic ischemic change is noted at the basal ganglia bilaterally, and at the left thalamus. The brainstem and fourth ventricle are within normal limits. The  cerebral hemispheres demonstrate grossly normal gray-white differentiation. No mass effect or midline shift is seen. Vascular: No hyperdense vessel or unexpected calcification. Skull: There is no evidence of fracture; visualized osseous structures are unremarkable in appearance. Sinuses/Orbits: The visualized portions of the orbits are within normal limits. The paranasal sinuses and mastoid air cells are well-aerated. Other: No significant soft tissue abnormalities are seen. CT CERVICAL SPINE FINDINGS Alignment: Normal. Skull base and vertebrae: No acute fracture. No primary bone lesion or focal pathologic process. There appears to be a chronic incompletely healed fracture at the right posterior first rib. Soft tissues and spinal canal: No prevertebral fluid or swelling. No visible canal hematoma. Disc levels: Mild intervertebral disc space narrowing is noted at C5-C6, with anterior and posterior disc osteophyte complexes. Mild degenerative change is noted about the dens. Upper chest: Mild vague nonspecific hypodensity is noted at the left thyroid lobe. The visualized portions of the thyroid gland are otherwise unremarkable. The visualized lung apices are clear. Mild calcification is noted at the carotid bifurcations bilaterally. Other: No additional soft tissue abnormalities are seen. IMPRESSION: 1. No evidence of traumatic intracranial injury or fracture. 2. No evidence of fracture or subluxation along the cervical spine. 3. Mild cortical volume loss and diffuse small vessel ischemic microangiopathy. 4. Chronic ischemic change at the basal ganglia bilaterally, and at the left thalamus. 5. Mild degenerative change at the lower cervical spine. 6. Mild calcification at the carotid bifurcations bilaterally. Carotid ultrasound could be considered for further evaluation, when and as deemed clinically appropriate. Electronically Signed   By: Roanna Raider M.D.   On: 04/13/2018 23:59   Ct Lumbar Spine Wo  Contrast  Result Date: 04/14/2018 CLINICAL DATA:  Initial evaluation for acute trauma, fall. Lower back pain. EXAM: CT LUMBAR SPINE WITHOUT CONTRAST TECHNIQUE: Multidetector CT imaging of the lumbar spine was performed without intravenous contrast administration. Multiplanar CT image reconstructions were also generated. COMPARISON:  Prior radiographs from 04/13/2018. FINDINGS: Segmentation: Normal segmentation. Lowest well-formed disc labeled the  L5-S1 level. Alignment: Trace retrolisthesis of L5 on S1. Mild levoscoliosis. Alignment otherwise normal with preservation of the normal lumbar lordosis. Vertebrae: Acute burst type compression fracture extends through the superior endplate of L1. Associated height loss of up to 40% with 3 mm bony retropulsion. Vertebral body heights otherwise maintained. No other acute fracture identified. Visualized sacrum and pelvis intact. SI joints approximated symmetric. Remotely healed fractures of the right posterior eleventh and twelfth ribs noted. Additional remotely healed fractures of the right transverse processes of L1, L2, L3, and L4. Remotely healed fracture of the left transverse process of L4. No discrete lytic or blastic osseous lesions. Paraspinal and other soft tissues: Paraspinous soft tissues demonstrate no acute finding. Advanced aorto bi-iliac atherosclerotic disease. Visualized visceral structures otherwise unremarkable. Disc levels: T12-L1: 3 mm bony retropulsion related to the acute L1 compression fracture. Mild disc desiccation. Mild bilateral facet hypertrophy. Central canal remains widely patent as do the bilateral neural foramina. L1-2:  Unremarkable. L2-3: Minimal disc bulge. Mild facet hypertrophy. No significant stenosis. L3-4: Diffuse disc bulge, asymmetric to the right. Moderate facet and ligamentum flavum hypertrophy, greater on the right. Resultant moderate canal with bilateral subarticular stenosis, right greater than left. Moderate right with mild  left L3 foraminal narrowing. L4-5: Chronic intervertebral disc space narrowing with disc desiccation. Superimposed broad right foraminal/extraforaminal disc protrusion (series 4, image 8). Protruding disc closely approximates the exiting right L4 nerve root as it courses of the left neural foramen. Additional disc bulge seen at the level of the left lateral recess extending laterally towards the left neural foramen (series 4, image 82). Moderate to advanced facet hypertrophy, right greater than left. Resultant moderate canal with right greater than left lateral recess stenosis. Moderate right with mild to moderate left L4 foraminal narrowing. L5-S1: Chronic intervertebral disc space narrowing with diffuse disc bulge and disc desiccation. Reactive endplate changes with marginal endplate osteophytic spurring. Mild to moderate facet hypertrophy. No significant spinal stenosis. Mild to moderate bilateral L5 foraminal narrowing. IMPRESSION: 1. Acute burst type compression fracture involving the superior endplate of L1 with associated 40% height loss and 3 mm bony retropulsion. No significant stenosis. 2. No other acute traumatic injury within the lumbar spine. 3. Chronic remotely healed fractures involving the right posterior eleventh and twelfth ribs as well as the right transverse processes of L1 through L4, along with the left transverse process of L4. 4. Moderate sized right foraminal/extraforaminal disc protrusion at L4-5, closely approximating and potentially affecting the exiting right L4 nerve root. 5. Disc bulge with facet hypertrophy at L3-4 and L4-5 with resultant moderate canal and bilateral subarticular stenosis as above. Electronically Signed   By: Rise Mu M.D.   On: 04/14/2018 01:11    Procedures .Marland KitchenLaceration Repair Date/Time: 04/14/2018 2:45 AM Performed by: Devoria Albe, MD Authorized by: Devoria Albe, MD   Consent:    Consent obtained:  Verbal   Consent given by:  Patient Anesthesia  (see MAR for exact dosages):    Anesthesia method:  Local infiltration   Local anesthetic:  Lidocaine 2% WITH epi Laceration details:    Location:  Face   Face location:  R eyebrow   Length (cm):  4   Laceration depth: through the dermis. Repair type:    Repair type:  Intermediate Pre-procedure details:    Preparation:  Patient was prepped and draped in usual sterile fashion Exploration:    Hemostasis achieved with:  Direct pressure   Wound extent: no foreign bodies/material noted     Contaminated:  no   Treatment:    Area cleansed with:  Saline   Amount of cleaning:  Standard Subcutaneous repair:    Suture size:  4-0   Suture material:  Vicryl   Suture technique:  Horizontal mattress   Number of sutures:  4 Skin repair:    Repair method:  Sutures   Suture size:  4-0   Suture material:  Nylon   Number of sutures:  7 Approximation:    Approximation:  Close Post-procedure details:    Dressing:  Antibiotic ointment and non-adherent dressing   Patient tolerance of procedure:  Tolerated well, no immediate complications   (including critical care time)  Medications Ordered in ED Medications  lidocaine-EPINEPHrine (XYLOCAINE W/EPI) 2 %-1:200000 (PF) injection 10 mL (10 mLs Infiltration Given by Other 04/14/18 0220)  Tdap (BOOSTRIX) injection 0.5 mL (0.5 mLs Intramuscular Given 04/14/18 0026)  potassium chloride SA (K-DUR,KLOR-CON) CR tablet 40 mEq (40 mEq Oral Given 04/14/18 0334)  HYDROcodone-acetaminophen (NORCO/VICODIN) 5-325 MG per tablet 1 tablet (1 tablet Oral Given 04/14/18 0334)     Initial Impression / Assessment and Plan / ED Course  I have reviewed the triage vital signs and the nursing notes.  Pertinent labs & imaging results that were available during my care of the patient were reviewed by me and considered in my medical decision making (see chart for details).     CT of the head and cervical spine were ordered.  I x-rayed his thoracic and lumbar spine.  Due  to some inconsistencies of his complaints of pain and his dementia the entire spine was studied including his head.  When I review his medication list there is no documentation of a prior tetanus booster so that was updated.  When I reviewed his radiology reports he was sent back to do a CT of his lumbar spine to look further at this L1 fracture.  Patient was noted to start having a raspy cough while suturing.  He was sent back to x-ray for chest x-ray.  TLSO was ordered for patient.  Family had been called about picking patient up to go home.  They called back at 5:25 AM and state they want him to be assessed by social worker for nursing home placement.  5:45 AM social worker consult ordered.  Family request nursing home placement.  Patient's home medications were ordered.  Review of the [place] shows no entries for his name.  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

## 2018-04-13 NOTE — ED Notes (Signed)
Patient transported to X-ray 

## 2018-04-14 ENCOUNTER — Emergency Department (HOSPITAL_COMMUNITY): Payer: No Typology Code available for payment source

## 2018-04-14 ENCOUNTER — Other Ambulatory Visit: Payer: Self-pay

## 2018-04-14 ENCOUNTER — Encounter (HOSPITAL_COMMUNITY): Payer: Self-pay | Admitting: *Deleted

## 2018-04-14 DIAGNOSIS — K625 Hemorrhage of anus and rectum: Secondary | ICD-10-CM

## 2018-04-14 DIAGNOSIS — F0151 Vascular dementia with behavioral disturbance: Secondary | ICD-10-CM

## 2018-04-14 DIAGNOSIS — E876 Hypokalemia: Secondary | ICD-10-CM

## 2018-04-14 DIAGNOSIS — S32010A Wedge compression fracture of first lumbar vertebra, initial encounter for closed fracture: Secondary | ICD-10-CM | POA: Diagnosis not present

## 2018-04-14 DIAGNOSIS — R296 Repeated falls: Secondary | ICD-10-CM

## 2018-04-14 DIAGNOSIS — S0181XD Laceration without foreign body of other part of head, subsequent encounter: Secondary | ICD-10-CM

## 2018-04-14 DIAGNOSIS — I10 Essential (primary) hypertension: Secondary | ICD-10-CM

## 2018-04-14 DIAGNOSIS — D72829 Elevated white blood cell count, unspecified: Secondary | ICD-10-CM

## 2018-04-14 DIAGNOSIS — F01518 Vascular dementia, unspecified severity, with other behavioral disturbance: Secondary | ICD-10-CM | POA: Diagnosis present

## 2018-04-14 DIAGNOSIS — S0181XA Laceration without foreign body of other part of head, initial encounter: Secondary | ICD-10-CM | POA: Diagnosis present

## 2018-04-14 DIAGNOSIS — K921 Melena: Secondary | ICD-10-CM

## 2018-04-14 LAB — URINALYSIS, ROUTINE W REFLEX MICROSCOPIC
Bilirubin Urine: NEGATIVE
Glucose, UA: NEGATIVE mg/dL
Hgb urine dipstick: NEGATIVE
Ketones, ur: NEGATIVE mg/dL
Leukocytes, UA: NEGATIVE
Nitrite: NEGATIVE
Protein, ur: NEGATIVE mg/dL
Specific Gravity, Urine: 1.017 (ref 1.005–1.030)
pH: 5 (ref 5.0–8.0)

## 2018-04-14 LAB — MAGNESIUM: MAGNESIUM: 2.3 mg/dL (ref 1.7–2.4)

## 2018-04-14 MED ORDER — LORAZEPAM 0.5 MG PO TABS
0.5000 mg | ORAL_TABLET | ORAL | Status: DC | PRN
  Administered 2018-04-15 (×2): 0.5 mg via ORAL
  Filled 2018-04-14 (×2): qty 1

## 2018-04-14 MED ORDER — HYDROCODONE-ACETAMINOPHEN 5-325 MG PO TABS
1.0000 | ORAL_TABLET | Freq: Four times a day (QID) | ORAL | Status: DC | PRN
  Administered 2018-04-14: 1 via ORAL
  Filled 2018-04-14: qty 1

## 2018-04-14 MED ORDER — DONEPEZIL HCL 5 MG PO TABS
5.0000 mg | ORAL_TABLET | Freq: Every day | ORAL | Status: DC
  Administered 2018-04-14 – 2018-04-16 (×3): 5 mg via ORAL
  Filled 2018-04-14 (×4): qty 1

## 2018-04-14 MED ORDER — MECLIZINE HCL 12.5 MG PO TABS
25.0000 mg | ORAL_TABLET | Freq: Every day | ORAL | Status: DC
  Administered 2018-04-14 – 2018-04-15 (×2): 25 mg via ORAL
  Filled 2018-04-14 (×3): qty 2

## 2018-04-14 MED ORDER — IPRATROPIUM-ALBUTEROL 0.5-2.5 (3) MG/3ML IN SOLN
3.0000 mL | Freq: Four times a day (QID) | RESPIRATORY_TRACT | Status: DC
  Administered 2018-04-14 – 2018-04-15 (×4): 3 mL via RESPIRATORY_TRACT
  Filled 2018-04-14 (×4): qty 3

## 2018-04-14 MED ORDER — OLANZAPINE 5 MG PO TABS
2.5000 mg | ORAL_TABLET | Freq: Every day | ORAL | Status: DC
  Administered 2018-04-14 – 2018-04-16 (×3): 2.5 mg via ORAL
  Filled 2018-04-14 (×3): qty 1

## 2018-04-14 MED ORDER — ACETAMINOPHEN 325 MG PO TABS: 650.0000 mg | ORAL_TABLET | Freq: Four times a day (QID) | ORAL | Status: DC | PRN

## 2018-04-14 MED ORDER — HYDROCODONE-ACETAMINOPHEN 5-325 MG PO TABS
1.0000 | ORAL_TABLET | Freq: Once | ORAL | Status: AC
  Administered 2018-04-14: 1 via ORAL
  Filled 2018-04-14: qty 1

## 2018-04-14 MED ORDER — SODIUM CHLORIDE 0.9 % IV SOLN
INTRAVENOUS | Status: AC
  Administered 2018-04-14: 22:00:00 via INTRAVENOUS

## 2018-04-14 MED ORDER — POTASSIUM CHLORIDE CRYS ER 20 MEQ PO TBCR
40.0000 meq | EXTENDED_RELEASE_TABLET | Freq: Two times a day (BID) | ORAL | Status: AC
  Administered 2018-04-14 – 2018-04-15 (×3): 40 meq via ORAL
  Filled 2018-04-14 (×4): qty 2

## 2018-04-14 MED ORDER — VITAMIN B-12 1000 MCG PO TABS
500.0000 ug | ORAL_TABLET | Freq: Every day | ORAL | Status: DC
  Administered 2018-04-16 – 2018-04-17 (×2): 500 ug via ORAL
  Filled 2018-04-14 (×2): qty 1
  Filled 2018-04-14 (×2): qty 0.5
  Filled 2018-04-14: qty 1

## 2018-04-14 MED ORDER — SIMVASTATIN 20 MG PO TABS
20.0000 mg | ORAL_TABLET | Freq: Every day | ORAL | Status: DC
  Administered 2018-04-15 – 2018-04-17 (×3): 20 mg via ORAL
  Filled 2018-04-14 (×3): qty 1

## 2018-04-14 MED ORDER — DOXYCYCLINE HYCLATE 100 MG PO TABS
100.0000 mg | ORAL_TABLET | Freq: Two times a day (BID) | ORAL | Status: DC
  Administered 2018-04-14 – 2018-04-17 (×6): 100 mg via ORAL
  Filled 2018-04-14 (×6): qty 1

## 2018-04-14 MED ORDER — OXYCODONE HCL 5 MG PO TABS
5.0000 mg | ORAL_TABLET | Freq: Four times a day (QID) | ORAL | Status: DC | PRN
  Administered 2018-04-15 – 2018-04-16 (×2): 5 mg via ORAL
  Filled 2018-04-14 (×2): qty 1

## 2018-04-14 MED ORDER — DOCUSATE SODIUM 100 MG PO CAPS
100.0000 mg | ORAL_CAPSULE | Freq: Two times a day (BID) | ORAL | Status: DC
  Administered 2018-04-14 – 2018-04-17 (×5): 100 mg via ORAL
  Filled 2018-04-14 (×6): qty 1

## 2018-04-14 MED ORDER — HYDROCODONE-ACETAMINOPHEN 5-325 MG PO TABS: 1.0000 | ORAL_TABLET | Freq: Four times a day (QID) | ORAL | 0 refills | Status: DC | PRN

## 2018-04-14 MED ORDER — LORAZEPAM 2 MG/ML IJ SOLN
INTRAMUSCULAR | Status: AC
  Filled 2018-04-14: qty 1

## 2018-04-14 MED ORDER — POTASSIUM CHLORIDE CRYS ER 20 MEQ PO TBCR
40.0000 meq | EXTENDED_RELEASE_TABLET | Freq: Once | ORAL | Status: AC
  Administered 2018-04-14: 40 meq via ORAL
  Filled 2018-04-14: qty 2

## 2018-04-14 MED ORDER — ACETAMINOPHEN 650 MG RE SUPP: 650.0000 mg | Freq: Four times a day (QID) | RECTAL | Status: DC | PRN

## 2018-04-14 MED ORDER — LORAZEPAM 2 MG/ML IJ SOLN
1.0000 mg | Freq: Once | INTRAMUSCULAR | Status: AC
  Administered 2018-04-14: 1 mg via INTRAVENOUS

## 2018-04-14 MED ORDER — ONDANSETRON HCL 4 MG/2ML IJ SOLN: 4.0000 mg | Freq: Four times a day (QID) | INTRAMUSCULAR | Status: DC | PRN

## 2018-04-14 MED ORDER — ONDANSETRON HCL 4 MG PO TABS: 4.0000 mg | ORAL_TABLET | Freq: Four times a day (QID) | ORAL | Status: DC | PRN

## 2018-04-14 MED ORDER — PANTOPRAZOLE SODIUM 40 MG PO TBEC
40.0000 mg | DELAYED_RELEASE_TABLET | Freq: Two times a day (BID) | ORAL | Status: DC
  Administered 2018-04-14 – 2018-04-17 (×7): 40 mg via ORAL
  Filled 2018-04-14 (×7): qty 1

## 2018-04-14 NOTE — Plan of Care (Signed)
  Problem: Acute Rehab PT Goals(only PT should resolve) Goal: Pt Will Go Supine/Side To Sit Outcome: Progressing Flowsheets (Taken 04/14/2018 1658) Pt will go Supine/Side to Sit: with min guard assist Goal: Patient Will Transfer Sit To/From Stand Outcome: Progressing Flowsheets (Taken 04/14/2018 1658) Patient will transfer sit to/from stand: with min guard assist Goal: Pt Will Transfer Bed To Chair/Chair To Bed Outcome: Progressing Flowsheets (Taken 04/14/2018 1658) Pt will Transfer Bed to Chair/Chair to Bed: min guard assist Goal: Pt Will Ambulate Outcome: Progressing Flowsheets (Taken 04/14/2018 1658) Pt will Ambulate: 100 feet; with minimal assist; with rolling walker   4:59 PM, 04/14/18 [name], MPT Physical Therapist with [facility] [phone] office [phone] mobile phone

## 2018-04-14 NOTE — ED Notes (Signed)
Social  worker in with patient 

## 2018-04-14 NOTE — ED Notes (Signed)
PT evaluating pt.

## 2018-04-14 NOTE — H&P (Signed)
History and Physical  Roberto Fitzpatrick WUJ:[id number] DOB: 26-Aug-1955 DOA: 04/13/2018  Referring physician: Estell Harpin MD PCP: Patient, No Pcp Per   Chief Complaint: frequent falls and laceration   HPI: Roberto Fitzpatrick is a 62 y.o. 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  He apparently had 5 witnessed falls today and sustained a laceration over his eye and had been complaining of back and neck pain.  He was recently admitted for a GI bleed but he declined to allow GI to do the endoscopy that they recommended and [facility].  [facility] at [facility].  His family says that they cannot care for him safely at home as he continues to have serious falls and his dementia continues to get worse.  He was found to have some mild dehydration and hypokalemia in ED and admission was requested.  Social worker has been consulted to work on SNF placement for patient.   Review of Systems: UTO due to dementia.   Past Medical History:  Diagnosis Date  . Hypertension   . Stroke (HCC)   . TIA (transient ischemic attack)    Past Surgical History:  Procedure Laterality Date  . APPENDECTOMY     Social History:  reports that he has been smoking. He has never used smokeless tobacco. He reports that he drinks alcohol. He reports that he does not use drugs.  No Known Allergies  Family History  Problem Relation Age of Onset  . Hypertension Mother   . Hypertension Brother     Prior to Admission medications   Medication Sig Start Date End Date Taking? Authorizing Provider  donepezil (ARICEPT) 10 MG tablet Take 5 mg by mouth at bedtime.    [provider]  HYDROcodone-acetaminophen (NORCO/VICODIN) 5-325 MG tablet Take 1 tablet by mouth every 6 (six) hours as needed for severe pain. 04/14/18   Devoria Albe, MD  Meclizine HCl 25 MG CHEW Chew 25 mg by mouth at  bedtime.    [provider]  pantoprazole (PROTONIX) 40 MG tablet Take 1 tablet (40 mg total) by mouth 2 (two) times daily before a meal. 03/27/18 04/26/18  Kendel Pesnell L, MD  simvastatin (ZOCOR) 20 MG tablet Take 20 mg by mouth daily.    [provider]  vitamin B-12 (CYANOCOBALAMIN) 500 MCG tablet Take 500 mcg by mouth daily.    [provider]   Physical Exam: Vitals:   04/14/18 0130 04/14/18 0200 04/14/18 0706 04/14/18 0940  BP: 118/82 123/76 109/79 120/76  Pulse:   79 93  Resp: (!) 25 (!) 28 13 12   Temp:      TempSrc:      SpO2:   91% 92%  Weight:      Height:         General exam: Moderately built and nourished patient, lying comfortably supine on the gurney in no obvious distress.  Head, eyes and ENT: 4 cm laceration above left eye repaired.  Normocephalic. Pupils equally reacting to light and accommodation. Oral mucosa dry.  Neck: Supple. No JVD, carotid bruit or thyromegaly.  No carotid bruit heard.    Lymphatics: No lymphadenopathy.  Respiratory system: diffuse rhonchi heard.  No increased work of breathing.  Cardiovascular system: S1 and S2 heard, RRR. No JVD, murmurs, gallops, clicks or pedal edema.  Gastrointestinal  system: Abdomen is nondistended, soft and nontender. Normal bowel sounds heard. No organomegaly or masses appreciated.  Central nervous system: Alert and oriented. No focal neurological deficits.  Extremities: Symmetric 5 x 5 power. Peripheral pulses symmetrically felt.   Skin: No rashes or acute findings.  Musculoskeletal system: very tender lumbar spine with palpation.  Negative exam.  Psychiatry: Pleasant and cooperative.  Labs on Admission:  Basic Metabolic Panel: Recent Labs  Lab 04/13/18 2243  NA 135  K 3.1*  CL 102  CO2 25  GLUCOSE 103*  BUN 14  CREATININE 0.81  CALCIUM 8.1*   Liver Function Tests: Recent Labs  Lab 04/13/18 2243  AST 19  ALT 11  ALKPHOS 102  BILITOT 0.7  PROT 6.8    ALBUMIN 3.1*   No results for input(s): LIPASE, AMYLASE in the last 168 hours. No results for input(s): AMMONIA in the last 168 hours. CBC: Recent Labs  Lab 04/13/18 2243  WBC 12.3*  HGB 12.1*  HCT 38.4*  MCV 88.9  PLT 375   Cardiac Enzymes: No results for input(s): CKTOTAL, CKMB, CKMBINDEX, TROPONINI in the last 168 hours.  BNP (last 3 results) No results for input(s): PROBNP in the last 8760 hours. CBG: Recent Labs  Lab 04/13/18 2246  GLUCAP 98    Radiological Exams on Admission: Dg Chest 1 View  Result Date: 04/14/2018 CLINICAL DATA:  Falling confusion. EXAM: CHEST  1 VIEW COMPARISON:  March 25, 2018 FINDINGS: Healed bilateral rib fractures. The heart, hila, mediastinum, lungs, and pleura are otherwise unremarkable. IMPRESSION: No active disease. Electronically Signed   By: Gerome Sam III M.D   On: 04/14/2018 02:57   Dg Thoracic Spine 2 View  Result Date: 04/14/2018 CLINICAL DATA:  Back pain after fall. EXAM: THORACIC SPINE 2 VIEWS COMPARISON:  Chest x-ray June 15, 2017 FINDINGS: The T7 compression fracture is stable. And L1 compression fracture was not definitely seen previously. However, the previous studies were limited due to level penetration. No other fractures. IMPRESSION: 1. There is a compression fracture of L1 which was not seen previously and may be acute. Recommend clinical correlation. A CT scan or MRI could further assess the L1 level if clinically warranted. 2. Stable T7 compression fraction. Electronically Signed   By: Gerome Sam III M.D   On: 04/14/2018 00:35   Dg Lumbar Spine Complete  Result Date: 04/14/2018 CLINICAL DATA:  Pain after fall EXAM: LUMBAR SPINE - COMPLETE 4+ VIEW COMPARISON:  None. FINDINGS: There is a compression fracture of L1 with approximately 30% loss of anterior height. No other fractures or malalignment. Mild multilevel degenerative disc disease. IMPRESSION: 1. There is a compression fracture of L1 with approximately  30% loss of anterior height. This region was not well evaluated on previous chest x-rays but was not definitely seen. An acute L1 compression fracture is not excluded. Electronically Signed   By: Gerome Sam III M.D   On: 04/14/2018 00:36   Ct Head Wo Contrast  Result Date: 04/13/2018 CLINICAL DATA:  Status post fall out of bed, with laceration at the right eyebrow. Neck pain. Initial encounter. EXAM: CT HEAD WITHOUT CONTRAST CT CERVICAL SPINE WITHOUT CONTRAST TECHNIQUE: Multidetector CT imaging of the head and cervical spine was performed following the standard protocol without intravenous contrast. Multiplanar CT image reconstructions of the cervical spine were also generated. COMPARISON:  CT of the head performed 03/25/2018, and CT of the cervical spine performed 06/15/2017 FINDINGS: CT HEAD FINDINGS Brain: No evidence of acute infarction, hemorrhage, hydrocephalus,  extra-axial collection or mass lesion / mass effect. Prominence of the ventricles and sulci reflects mild cortical volume loss. Diffuse periventricular subcortical white matter change likely reflects small vessel ischemic microangiopathy. Chronic ischemic change is noted at the basal ganglia bilaterally, and at the left thalamus. The brainstem and fourth ventricle are within normal limits. The cerebral hemispheres demonstrate grossly normal gray-white differentiation. No mass effect or midline shift is seen. Vascular: No hyperdense vessel or unexpected calcification. Skull: There is no evidence of fracture; visualized osseous structures are unremarkable in appearance. Sinuses/Orbits: The visualized portions of the orbits are within normal limits. The paranasal sinuses and mastoid air cells are well-aerated. Other: No significant soft tissue abnormalities are seen. CT CERVICAL SPINE FINDINGS Alignment: Normal. Skull base and vertebrae: No acute fracture. No primary bone lesion or focal pathologic process. There appears to be a chronic  incompletely healed fracture at the right posterior first rib. Soft tissues and spinal canal: No prevertebral fluid or swelling. No visible canal hematoma. Disc levels: Mild intervertebral disc space narrowing is noted at C5-C6, with anterior and posterior disc osteophyte complexes. Mild degenerative change is noted about the dens. Upper chest: Mild vague nonspecific hypodensity is noted at the left thyroid lobe. The visualized portions of the thyroid gland are otherwise unremarkable. The visualized lung apices are clear. Mild calcification is noted at the carotid bifurcations bilaterally. Other: No additional soft tissue abnormalities are seen. IMPRESSION: 1. No evidence of traumatic intracranial injury or fracture. 2. No evidence of fracture or subluxation along the cervical spine. 3. Mild cortical volume loss and diffuse small vessel ischemic microangiopathy. 4. Chronic ischemic change at the basal ganglia bilaterally, and at the left thalamus. 5. Mild degenerative change at the lower cervical spine. 6. Mild calcification at the carotid bifurcations bilaterally. Carotid ultrasound could be considered for further evaluation, when and as deemed clinically appropriate. Electronically Signed   By: Roanna Raider M.D.   On: 04/13/2018 23:59   Ct Cervical Spine Wo Contrast  Result Date: 04/13/2018 CLINICAL DATA:  Status post fall out of bed, with laceration at the right eyebrow. Neck pain. Initial encounter. EXAM: CT HEAD WITHOUT CONTRAST CT CERVICAL SPINE WITHOUT CONTRAST TECHNIQUE: Multidetector CT imaging of the head and cervical spine was performed following the standard protocol without intravenous contrast. Multiplanar CT image reconstructions of the cervical spine were also generated. COMPARISON:  CT of the head performed 03/25/2018, and CT of the cervical spine performed 06/15/2017 FINDINGS: CT HEAD FINDINGS Brain: No evidence of acute infarction, hemorrhage, hydrocephalus, extra-axial collection or mass  lesion / mass effect. Prominence of the ventricles and sulci reflects mild cortical volume loss. Diffuse periventricular subcortical white matter change likely reflects small vessel ischemic microangiopathy. Chronic ischemic change is noted at the basal ganglia bilaterally, and at the left thalamus. The brainstem and fourth ventricle are within normal limits. The cerebral hemispheres demonstrate grossly normal gray-white differentiation. No mass effect or midline shift is seen. Vascular: No hyperdense vessel or unexpected calcification. Skull: There is no evidence of fracture; visualized osseous structures are unremarkable in appearance. Sinuses/Orbits: The visualized portions of the orbits are within normal limits. The paranasal sinuses and mastoid air cells are well-aerated. Other: No significant soft tissue abnormalities are seen. CT CERVICAL SPINE FINDINGS Alignment: Normal. Skull base and vertebrae: No acute fracture. No primary bone lesion or focal pathologic process. There appears to be a chronic incompletely healed fracture at the right posterior first rib. Soft tissues and spinal canal: No prevertebral fluid or swelling. No  visible canal hematoma. Disc levels: Mild intervertebral disc space narrowing is noted at C5-C6, with anterior and posterior disc osteophyte complexes. Mild degenerative change is noted about the dens. Upper chest: Mild vague nonspecific hypodensity is noted at the left thyroid lobe. The visualized portions of the thyroid gland are otherwise unremarkable. The visualized lung apices are clear. Mild calcification is noted at the carotid bifurcations bilaterally. Other: No additional soft tissue abnormalities are seen. IMPRESSION: 1. No evidence of traumatic intracranial injury or fracture. 2. No evidence of fracture or subluxation along the cervical spine. 3. Mild cortical volume loss and diffuse small vessel ischemic microangiopathy. 4. Chronic ischemic change at the basal ganglia  bilaterally, and at the left thalamus. 5. Mild degenerative change at the lower cervical spine. 6. Mild calcification at the carotid bifurcations bilaterally. Carotid ultrasound could be considered for further evaluation, when and as deemed clinically appropriate. Electronically Signed   By: Roanna RaiderJeffery  Chang M.D.   On: 04/13/2018 23:59   Ct Lumbar Spine Wo Contrast  Result Date: 04/14/2018 CLINICAL DATA:  Initial evaluation for acute trauma, fall. Lower back pain. EXAM: CT LUMBAR SPINE WITHOUT CONTRAST TECHNIQUE: Multidetector CT imaging of the lumbar spine was performed without intravenous contrast administration. Multiplanar CT image reconstructions were also generated. COMPARISON:  Prior radiographs from 04/13/2018. FINDINGS: Segmentation: Normal segmentation. Lowest well-formed disc labeled the L5-S1 level. Alignment: Trace retrolisthesis of L5 on S1. Mild levoscoliosis. Alignment otherwise normal with preservation of the normal lumbar lordosis. Vertebrae: Acute burst type compression fracture extends through the superior endplate of L1. Associated height loss of up to 40% with 3 mm bony retropulsion. Vertebral body heights otherwise maintained. No other acute fracture identified. Visualized sacrum and pelvis intact. SI joints approximated symmetric. Remotely healed fractures of the right posterior eleventh and twelfth ribs noted. Additional remotely healed fractures of the right transverse processes of L1, L2, L3, and L4. Remotely healed fracture of the left transverse process of L4. No discrete lytic or blastic osseous lesions. Paraspinal and other soft tissues: Paraspinous soft tissues demonstrate no acute finding. Advanced aorto bi-iliac atherosclerotic disease. Visualized visceral structures otherwise unremarkable. Disc levels: T12-L1: 3 mm bony retropulsion related to the acute L1 compression fracture. Mild disc desiccation. Mild bilateral facet hypertrophy. Central canal remains widely patent as do the  bilateral neural foramina. L1-2:  Unremarkable. L2-3: Minimal disc bulge. Mild facet hypertrophy. No significant stenosis. L3-4: Diffuse disc bulge, asymmetric to the right. Moderate facet and ligamentum flavum hypertrophy, greater on the right. Resultant moderate canal with bilateral subarticular stenosis, right greater than left. Moderate right with mild left L3 foraminal narrowing. L4-5: Chronic intervertebral disc space narrowing with disc desiccation. Superimposed broad right foraminal/extraforaminal disc protrusion (series 4, image 8). Protruding disc closely approximates the exiting right L4 nerve root as it courses of the left neural foramen. Additional disc bulge seen at the level of the left lateral recess extending laterally towards the left neural foramen (series 4, image 82). Moderate to advanced facet hypertrophy, right greater than left. Resultant moderate canal with right greater than left lateral recess stenosis. Moderate right with mild to moderate left L4 foraminal narrowing. L5-S1: Chronic intervertebral disc space narrowing with diffuse disc bulge and disc desiccation. Reactive endplate changes with marginal endplate osteophytic spurring. Mild to moderate facet hypertrophy. No significant spinal stenosis. Mild to moderate bilateral L5 foraminal narrowing. IMPRESSION: 1. Acute burst type compression fracture involving the superior endplate of L1 with associated 40% height loss and 3 mm bony retropulsion. No significant stenosis. 2.  No other acute traumatic injury within the lumbar spine. 3. Chronic remotely healed fractures involving the right posterior eleventh and twelfth ribs as well as the right transverse processes of L1 through L4, along with the left transverse process of L4. 4. Moderate sized right foraminal/extraforaminal disc protrusion at L4-5, closely approximating and potentially affecting the exiting right L4 nerve root. 5. Disc bulge with facet hypertrophy at L3-4 and L4-5 with  resultant moderate canal and bilateral subarticular stenosis as above. Electronically Signed   By: Rise Mu M.D.   On: 04/14/2018 01:11    EKG: Independently reviewed.   Assessment/Plan Principal Problem:   Compression fracture of L1 lumbar vertebra (HCC) Active Problems:   Rectal bleed   CVA (cerebral vascular accident) (HCC)   Melena   Vascular dementia with behavioral disturbance (HCC)   Falls frequently   Facial laceration   Hypokalemia   Leukocytosis   Hypertension   1. Acute L1 vertebral compression fracture - Pt has been placed in a TLSO brace and will consult orthopedics.  Pain management ordered. Fall precautions.   2. GI bleed - Pt had refused GI work up at last recent admission less than 2 weeks ago. Hg remains stable.  Follow.  He had elected to follow up with [facility]. Continue protonix.   3. Carotid arterial disease - carotid doppler US ordered.  4. Frequent falls- fall precautions, PT recommending SNF.  5. Leukocytosis - Pt appears to have an acute bronchitis/URI - will prescribe doxycycline 100 mg BID and follow clinically.   6. Essential hypertension - resume home medications and follow.  7. Hypokalemia - oral replacement ordered, check magnesium, follow.   DVT Prophylaxis: SCDs Code Status: Full   Family Communication: telephone   Disposition Plan: SNF    Time spent: 58 minutes   Standley Dakins, MD [facility] Pager [phone]  If 7PM-7AM, please contact night-coverage [email or web address] Password [email or web address] 04/14/2018, 1:24 PM

## 2018-04-14 NOTE — Discharge Instructions (Addendum)
Mr. [name] has a fracture of his first lumbar vertebrae.  He will be fitted with a brace to support his back.  He was prescribed pain medication.  He will need to follow-up with a back specialist to make sure he is healing okay.  [facility] or you can call Dr. Jeni SallesMeyron, the neurosurgeon on-call.  He should be rechecked if he is unable to urinate.  Marland Kitchen.  He should be rechecked if he has any problems listed on the head injury sheet.    The sutures need to be removed in 2-3 days.  Please take him to any clinic or urgent care or his primary care provider to have them removed.    WEAR TLSO BRACE AT ALL TIMES UNTIL YOU FOLLOW UP WITH NEUROSURGERY OFFICE.   Seek medical care or return to ER if symptoms worsen, don't improve or new problem develops.

## 2018-04-14 NOTE — ED Notes (Signed)
Pt still sleeping soundly.

## 2018-04-14 NOTE — ED Notes (Signed)
Pt's daughter-in-law called back after speaking with the caregiver and she along with pt's son want the patient to be set up for round-the-clock care or nursing home placement. They have a part-time caregiver but are unable to meet the needs of the pt. They state that pt is unable to care for self and continues to suffer from multiple falls. Pt has Veterans benefits but they do not feel very confident in the system as they feel they never receive answers from care provided at [facility] hospitals. Pt's son is Dyon Rotert and he can be reached at [phone] to answer any questions concerning pt and his needs. Dr Lynelle Doctor notified of family's inability to take pt back home.

## 2018-04-14 NOTE — Clinical Social Work Note (Addendum)
Clinical Social Work Assessment  Patient Details  Name: Roberto Fitzpatrick MRN: [id number] Date of Birth: 18-Apr-1956  Date of referral:  04/14/18               Reason for consult:  Discharge Planning, Other (Comment Required)(Gather information from family, determine dispostional plan)                Permission sought to share information with:  Family Supports Permission granted to share information::  Yes, Verbal Permission Granted  Name::     Neysa Hotter  Agency::     Relationship::  Brother  Contact Information:  [phone]   Housing/Transportation Living arrangements for the past 2 months:  Single Family Home Source of Information:  Adult Children, Patient Patient Interpreter Needed:  None Criminal Activity/Legal Involvement Pertinent to Current Situation/Hospitalization:  No - Comment as needed Significant Relationships:  Adult Children, Other(Comment)(In-home caregiver) Lives with:    Do you feel safe going back to the place where you live?  No Need for family participation in patient care:  Yes (Comment)  Care giving concerns:  Brother states that although patient has full time caregiver, she is not trained nor licensed, and when she first started with father several years ago, he was still able to cook for self and do ADL's.  His needs have now increased to the point that he is unable to do ADL's, and he is falling and caregiver has not been able to prevent this.  Brother hoping for placement of patient. Explained that we could get PT evaluation and [facility] if [facility], but if patient not admitted, he would need to return home and family would need to follow up from there. Brother confirms that patient gets medical care in [place] and [place] at [place], but also admits that patient is reluctant to go, and visits are rare.  Social Worker assessment / plan:  Mr Ramone is a 62 YO Caucasian male diagnosed with post status CVA and vascular dementia. He presents with  increased unsteadiness with subsequent falls, and inability to perform ADLs along with altered mental status.  He is difficult to interview as he responds very slowly to questions with few words, or simply does not answer.  He can benefit from SNF placement for rehabilitation, and/or longer term placement for pn-going care.  Employment status:  Disabled (Comment on whether or not currently receiving Disability) Insurance information:  [employer] Benefit PT Recommendations:  Skilled Nursing Facility Information / Referral to community resources:     Patient/Family's Response to care:  Family is asking for help with care.  Patient/Family's Understanding of and Emotional Response to Diagnosis, Current Treatment, and Prognosis:  Family understands that patient's baseline will likely continue to decline.  Emotional Assessment Appearance:  Disheveled, Appears older than stated age Attitude/Demeanor/Rapport:  Other(Easily distracted, extremely slow to speak, minimal interaction) Affect (typically observed):  Blunt, Flat Orientation:  Oriented to Self, Oriented to Place Alcohol / Substance use:  Tobacco Use Psych involvement (Current and /or in the community):  No (Comment)  Discharge Needs  Concerns to be addressed:  Discharge Planning Concerns, Home Safety Concerns Readmission within the last 30 days:  Yes Current discharge risk:  Cognitively Impaired, Dependent with Mobility Barriers to Discharge:  Unsafe home situation   Ida Rogue, LCSW 04/14/2018, 3:03 PM

## 2018-04-14 NOTE — Evaluation (Signed)
Physical Therapy Evaluation Patient Details Name: Roberto Fitzpatrick MRN: [id number] DOB: 05/17/1956 Today's Date: 04/14/2018   History of Present Illness  Roberto Fitzpatrick is a 62 y/o male with frequent falls  Clinical Impression  Patient presents with TLSO on and numerous bruises all over body with stiches over right eye.  Patient mostly nonverbal, but able to follow directions when prompted, demonstrates slow labored movement for sitting up and and required much assistance to ambulate in hallway using RW.  Patient frequently scissors legs and requires verbal/tactile cueing for proper hand placement on RW.  Patient put back to bed after therapy - RN notified.  Patient will benefit from continued physical therapy in hospital and recommended venue below to increase strength, balance, endurance for safe ADLs and gait.    Follow Up Recommendations SNF    Equipment Recommendations  None recommended by PT    Recommendations for Other Services       Precautions / Restrictions Precautions Precautions: Fall Required Braces or Orthoses: Spinal Brace Spinal Brace: Thoracolumbosacral orthotic Restrictions Weight Bearing Restrictions: No      Mobility  Bed Mobility Overal bed mobility: Needs Assistance Bed Mobility: Supine to Sit;Sit to Supine     Supine to sit: Min assist Sit to supine: Min assist   General bed mobility comments: slow labored movement  Transfers Overall transfer level: Needs assistance Equipment used: Rolling walker (2 wheeled) Transfers: Sit to/from [employer];Stand Pivot Transfers Sit to Stand: Mod assist Stand pivot transfers: Mod assist       General transfer comment: requires max verbal/tactile cueing  Ambulation/Gait Ambulation/Gait assistance: Mod assist Gait Distance (Feet): 40 Feet Assistive device: Rolling walker (2 wheeled) Gait Pattern/deviations: Decreased step length - right;Decreased step length - left;Decreased stride length Gait velocity:  decreased   General Gait Details: slow labored movement  Stairs            Wheelchair Mobility    Modified Rankin (Stroke Patients Only)       Balance Overall balance assessment: Needs assistance Sitting-balance support: No upper extremity supported;Feet supported Sitting balance-Leahy Scale: Fair     Standing balance support: Bilateral upper extremity supported;During functional activity Standing balance-Leahy Scale: Poor Standing balance comment: fair/poor with RW                             Pertinent Vitals/Pain Pain Assessment: No/denies pain    Home Living Family/patient expects to be discharged to:: Private residence Living Arrangements: Other relatives Available Help at Discharge: Family                  Prior Function Level of Independence: Needs assistance   Gait / Transfers Assistance Needed: household ambulator with RW  ADL's / Homemaking Assistance Needed: assisted by family        Hand Dominance        Extremity/Trunk Assessment   Upper Extremity Assessment Upper Extremity Assessment: Generalized weakness    Lower Extremity Assessment Lower Extremity Assessment: Generalized weakness    Cervical / Trunk Assessment Cervical / Trunk Assessment: Kyphotic  Communication   Communication: No difficulties  Cognition Arousal/Alertness: Awake/alert Behavior During Therapy: Restless;Agitated Overall Cognitive Status: No family/caregiver present to determine baseline cognitive functioning                                        General Comments  Exercises     Assessment/Plan    PT Assessment Patient needs continued PT services  PT Problem List Decreased strength;Decreased activity tolerance;Decreased balance;Decreased mobility;Decreased safety awareness       PT Treatment Interventions Gait training;Stair training;Functional mobility training;Therapeutic activities;Therapeutic  exercise;Patient/family education    PT Goals (Current goals can be found in the Care Plan section)  Acute Rehab PT Goals Patient Stated Goal: return home PT Goal Formulation: With patient Time For Goal Achievement: 04/28/18 Potential to Achieve Goals: Fair    Frequency Min 3X/week   Barriers to discharge        Co-evaluation               AM-PAC PT "6 Clicks" Daily Activity  Outcome Measure Difficulty turning over in bed (including adjusting bedclothes, sheets and blankets)?: Unable Difficulty moving from lying on back to sitting on the side of the bed? : Unable Difficulty sitting down on and standing up from a chair with arms (e.g., wheelchair, bedside commode, etc,.)?: Unable Help needed moving to and from a bed to chair (including a wheelchair)?: A Lot Help needed walking in hospital room?: A Lot Help needed climbing 3-5 steps with a railing? : A Lot 6 Click Score: 9    End of Session Equipment Utilized During Treatment: Gait belt Activity Tolerance: Patient tolerated treatment well;Patient limited by fatigue;Treatment limited secondary to agitation Patient left: in bed;with call bell/phone within reach Nurse Communication: Mobility status PT Visit Diagnosis: Unsteadiness on feet (R26.81);Other abnormalities of gait and mobility (R26.89);Muscle weakness (generalized) (M62.81)    Time: 4098-1191 PT Time Calculation (min) (ACUTE ONLY): 33 min   Charges:   PT Evaluation $PT Eval Moderate Complexity: 1 Mod PT Treatments $Therapeutic Activity: 23-37 mins        4:57 PM, 04/14/18 [name], MPT Physical Therapist with [facility] [phone] office [phone] mobile phone

## 2018-04-14 NOTE — ED Notes (Addendum)
Pt has tried to climb out of bed after being told repeatedly to use his call bell. This nurse was in the room with another patient and the secretary yelled that he had fallen. Immediately went into the room to assess patient. Had feces everywhere. Have cleaned pt and gotten him back into bed. Safety zone portal done as well as notified MD. No harm to patient.

## 2018-04-14 NOTE — ED Notes (Signed)
[employer] paged for TLSO brace application.

## 2018-04-14 NOTE — ED Notes (Signed)
Pt is now in the bed asleep with equal rise and chest fall. Will give medications upon waking

## 2018-04-14 NOTE — ED Notes (Signed)
Contacted pt's caregiver Davy PiqueSandy Vernon at [phone] to let her know pt was up for discharge and would be fitted for a special brace if she wanted to come on to the hospital so she could see how to take new brace off and on. Caregiver states she does not drive and that pt's brother would need to come after him. States she would try to get in touch with brother and call me back.

## 2018-04-14 NOTE — ED Notes (Addendum)
Pt is not more calm and cooperative. Is sleeping now. Have saved a lunch tray for him

## 2018-04-14 NOTE — ED Provider Notes (Signed)
Patient was seen by Child psychotherapist for possible nursing home placement.  Social worker stated it will most likely be at least 3 days for the patient can be placed in a nursing home.  He will be admitted to medicine for nursing home placement and hypokalemia and frequent falls   Bethann Berkshire, MD 04/14/18 1317

## 2018-04-15 ENCOUNTER — Inpatient Hospital Stay (HOSPITAL_COMMUNITY): Payer: No Typology Code available for payment source

## 2018-04-15 ENCOUNTER — Encounter (HOSPITAL_COMMUNITY): Payer: Self-pay | Admitting: Family Medicine

## 2018-04-15 DIAGNOSIS — R296 Repeated falls: Secondary | ICD-10-CM | POA: Diagnosis not present

## 2018-04-15 DIAGNOSIS — S0181XD Laceration without foreign body of other part of head, subsequent encounter: Secondary | ICD-10-CM | POA: Diagnosis not present

## 2018-04-15 DIAGNOSIS — S32010D Wedge compression fracture of first lumbar vertebra, subsequent encounter for fracture with routine healing: Secondary | ICD-10-CM | POA: Diagnosis not present

## 2018-04-15 DIAGNOSIS — I10 Essential (primary) hypertension: Secondary | ICD-10-CM | POA: Diagnosis not present

## 2018-04-15 LAB — COMPREHENSIVE METABOLIC PANEL
ALK PHOS: 98 U/L (ref 38–126)
ALT: 9 U/L (ref 0–44)
ANION GAP: 8 (ref 5–15)
AST: 14 U/L — ABNORMAL LOW (ref 15–41)
Albumin: 3 g/dL — ABNORMAL LOW (ref 3.5–5.0)
BUN: 9 mg/dL (ref 8–23)
CALCIUM: 8.5 mg/dL — AB (ref 8.9–10.3)
CO2: 25 mmol/L (ref 22–32)
Chloride: 107 mmol/L (ref 98–111)
Creatinine, Ser: 0.76 mg/dL (ref 0.61–1.24)
Glucose, Bld: 73 mg/dL (ref 70–99)
Potassium: 4 mmol/L (ref 3.5–5.1)
Sodium: 140 mmol/L (ref 135–145)
TOTAL PROTEIN: 6.3 g/dL — AB (ref 6.5–8.1)
Total Bilirubin: 0.7 mg/dL (ref 0.3–1.2)

## 2018-04-15 LAB — CBC WITH DIFFERENTIAL/PLATELET
ABS IMMATURE GRANULOCYTES: 0.03 10*3/uL (ref 0.00–0.07)
Basophils Absolute: 0 10*3/uL (ref 0.0–0.1)
Basophils Relative: 1 %
EOS PCT: 2 %
Eosinophils Absolute: 0.2 10*3/uL (ref 0.0–0.5)
HEMATOCRIT: 36.9 % — AB (ref 39.0–52.0)
HEMOGLOBIN: 11.5 g/dL — AB (ref 13.0–17.0)
Immature Granulocytes: 0 %
Lymphocytes Relative: 21 %
Lymphs Abs: 1.5 10*3/uL (ref 0.7–4.0)
MCH: 28.3 pg (ref 26.0–34.0)
MCHC: 31.2 g/dL (ref 30.0–36.0)
MCV: 90.9 fL (ref 80.0–100.0)
Monocytes Absolute: 0.7 10*3/uL (ref 0.1–1.0)
Monocytes Relative: 9 %
NEUTROS PCT: 67 %
NRBC: 0 % (ref 0.0–0.2)
Neutro Abs: 4.9 10*3/uL (ref 1.7–7.7)
Platelets: 435 10*3/uL — ABNORMAL HIGH (ref 150–400)
RBC: 4.06 MIL/uL — AB (ref 4.22–5.81)
RDW: 14.5 % (ref 11.5–15.5)
WBC: 7.3 10*3/uL (ref 4.0–10.5)

## 2018-04-15 LAB — MAGNESIUM: MAGNESIUM: 2.3 mg/dL (ref 1.7–2.4)

## 2018-04-15 MED ORDER — IPRATROPIUM-ALBUTEROL 0.5-2.5 (3) MG/3ML IN SOLN
3.0000 mL | Freq: Three times a day (TID) | RESPIRATORY_TRACT | Status: DC
  Administered 2018-04-16 – 2018-04-17 (×5): 3 mL via RESPIRATORY_TRACT
  Filled 2018-04-15 (×5): qty 3

## 2018-04-15 NOTE — [place] FL2 (Signed)
[place] MEDICAID FL2 LEVEL OF CARE SCREENING TOOL     IDENTIFICATION  Patient Name: Roberto Fitzpatrick Birthdate: 08-Aug-1955 Sex: male Admission Date (Current Location): 04/13/2018  [facility] and [place] Number:  [employer] and Address:  [facility],  [address], [place] [address]      Provider Number: [id number]  Attending Physician Name and Address:  Cleora Fleet, MD  Relative Name and Phone Number:       Current Level of Care: Hospital Recommended Level of Care: Skilled Nursing Facility Prior Approval Number:    Date Approved/Denied:   PASRR Number: [id number]  Discharge Plan: SNF    Current Diagnoses: Patient Active Problem List   Diagnosis Date Noted  . Compression fracture of L1 lumbar vertebra (HCC) 04/14/2018  . Vascular dementia with behavioral disturbance (HCC) 04/14/2018  . Falls frequently 04/14/2018  . Facial laceration 04/14/2018  . Hypokalemia 04/14/2018  . Leukocytosis 04/14/2018  . Hypertension 04/14/2018  . Melena   . Rectal bleed 03/25/2018  . CVA (cerebral vascular accident) (HCC) 03/25/2018    Orientation RESPIRATION BLADDER Height & Weight     Self, Place  Normal Incontinent Weight: 68.5 kg Height:  5\' 7"  (170.2 cm)  BEHAVIORAL SYMPTOMS/MOOD NEUROLOGICAL BOWEL NUTRITION STATUS  (none) (none) Continent (regular)  AMBULATORY STATUS COMMUNICATION OF NEEDS Skin   Extensive Assist Verbally Other (Comment)(laceration above r eye; non-pressure wound r upper back)                       Personal Care Assistance Level of Assistance  Bathing, Feeding, Dressing Bathing Assistance: Limited assistance Feeding assistance: Limited assistance Dressing Assistance: Limited assistance     Functional Limitations Info  Sight, Hearing, Speech Sight Info: Adequate Hearing Info: Adequate Speech Info: Adequate    SPECIAL CARE FACTORS FREQUENCY  PT (By licensed PT)     PT Frequency: 5x/w               Contractures Contractures Info: Not present    Additional Factors Info  Code Status, Allergies Code Status Info: full Allergies Info: NKA           Current Medications (04/15/2018):  This is the current hospital active medication list Current Facility-Administered Medications  Medication Dose Route Frequency Provider Last Rate Last Dose  . 0.9 %  sodium chloride infusion   Intravenous Continuous Laural Benes, Clanford L, MD 35 mL/hr at 04/14/18 2224    . acetaminophen (TYLENOL) tablet 650 mg  650 mg Oral Q6H PRN Johnson, Clanford L, MD       Or  . acetaminophen (TYLENOL) suppository 650 mg  650 mg Rectal Q6H PRN Johnson, Clanford L, MD      . docusate sodium (COLACE) capsule 100 mg  100 mg Oral BID Laural Benes, Clanford L, MD   100 mg at 04/14/18 2213  . donepezil (ARICEPT) tablet 5 mg  5 mg Oral QHS Johnson, Clanford L, MD   5 mg at 04/14/18 2213  . doxycycline (VIBRA-TABS) tablet 100 mg  100 mg Oral Q12H Johnson, Clanford L, MD   100 mg at 04/14/18 2213  . ipratropium-albuterol (DUONEB) 0.5-2.5 (3) MG/3ML nebulizer solution 3 mL  3 mL Nebulization Q6H Johnson, Clanford L, MD   3 mL at 04/15/18 0746  . LORazepam (ATIVAN) tablet 0.5 mg  0.5 mg Oral Q4H PRN Johnson, Clanford L, MD      . meclizine (ANTIVERT) tablet 25 mg  25 mg Oral QHS Johnson, Clanford L,  MD   25 mg at 04/14/18 2214  . OLANZapine (ZYPREXA) tablet 2.5 mg  2.5 mg Oral QHS Johnson, Clanford L, MD   2.5 mg at 04/14/18 2213  . ondansetron (ZOFRAN) tablet 4 mg  4 mg Oral Q6H PRN Johnson, Clanford L, MD       Or  . ondansetron (ZOFRAN) injection 4 mg  4 mg Intravenous Q6H PRN Johnson, Clanford L, MD      . oxyCODONE (Oxy IR/ROXICODONE) immediate release tablet 5 mg  5 mg Oral Q6H PRN Johnson, Clanford L, MD      . pantoprazole (PROTONIX) EC tablet 40 mg  40 mg Oral BID AC Johnson, Clanford L, MD   40 mg at 04/14/18 1920  . potassium chloride SA (K-DUR,KLOR-CON) CR tablet 40 mEq  40 mEq Oral BID Laural BenesJohnson, Clanford L, MD   40 mEq  at 04/14/18 2213  . simvastatin (ZOCOR) tablet 20 mg  20 mg Oral Daily Johnson, Clanford L, MD      . vitamin B-12 (CYANOCOBALAMIN) tablet 500 mcg  500 mcg Oral Daily Johnson, Clanford L, MD         Discharge Medications: Please see discharge summary for a list of discharge medications.  Relevant Imaging Results:  Relevant Lab Results:   Additional Information    Ida Rogueodney B Sheena Simonis, LCSW

## 2018-04-15 NOTE — Clinical Social Work Placement (Signed)
   CLINICAL SOCIAL WORK PLACEMENT  NOTE  Date:  04/15/2018  Patient Details  Name: Roberto Fitzpatrick MRN: [id number] Date of Birth: 10/08/1955  Clinical Social Work is seeking post-discharge placement for this patient at the Skilled  Nursing Facility level of care (*CSW will initial, date and re-position this form in  chart as items are completed):  No   Patient/family provided with [facility] Clinical [facility] Work Department's list of facilities offering this level of care within the geographic area requested by the patient (or if unable, by the patient's family).  No   Patient/family informed of their freedom to choose among providers that offer the needed level of care, that participate in Medicare, Medicaid or managed care program needed by the patient, have an available bed and are willing to accept the patient.  No   Patient/family informed of [facility] [place]'s ownership interest in [facility] and [facility], as well as of the fact that they are under no obligation to receive care at these facilities.  PASRR submitted to EDS on 04/14/18     PASRR number received on 04/15/18     Existing PASRR number confirmed on       FL2 transmitted to all facilities in geographic area requested by pt/family on       FL2 transmitted to all facilities within larger geographic area on 04/15/18     Patient informed that his/her managed care company has contracts with or will negotiate with certain facilities, including the following:            Patient/family informed of bed offers received.  Patient chooses bed at       Physician recommends and patient chooses bed at      Patient to be transferred to   on  .  Patient to be transferred to facility by       Patient family notified on   of transfer.  Name of family member notified:        PHYSICIAN       Additional Comment: PT has [employer] insurance.  Referral for placment FAXed to  Leanna SatoNancy Lau, RN, MSN [facility] [address], [place], [place] [address] Mobile: [phone] _______________________________________________ Ida Rogueodney B Timo Hartwig, LCSW 04/15/2018, 1:38 PM

## 2018-04-15 NOTE — Progress Notes (Signed)
PROGRESS NOTE  Roberto RilyJohn W Fitzpatrick  ZOX:[id number]  DOB: 10/18/1955  DOA: 04/13/2018 PCP: Patient, No Pcp Per  Brief Admission Hx: Roberto RilyJohn W Fitzpatrick is a 62 y.o. 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  He apparently had 5 witnessed falls today and sustained a laceration over his eye and had been complaining of back and neck pain.   MDM/Assessment & Plan: 1. Acute L1 vertebral compression fracture - Pt has been placed in a TLSO brace and will consult orthopedics.  Pain management ordered. Fall precautions.   PT recommended SNF rehab.  Social workers assisting with placement.  2. GI bleed - Pt had refused GI work up at last recent admission less than 2 weeks ago. Hg remains stable.  Follow.  [facility]. Continue protonix.   Pt still refusing work up in the hospital.  His Hg fairly stable so far.  Monitor.  3. Carotid arterial disease - carotid doppler with no significant stenosis found.   4. Frequent falls- fall precautions, PT recommending SNF.  5. Leukocytosis - Pt appears to have an acute bronchitis/URI - will prescribe doxycycline 100 mg BID and follow clinically.   Continue scheduled nebs.   6. Essential hypertension - resume home medications and follow.  7. Hypokalemia - oral replacement ordered, check magnesium, follow.   DVT Prophylaxis: SCDs Code Status: Full   Family Communication: telephone   Disposition Plan: SNF    Consultants:  orthopedics  Procedures:  N/A  Antimicrobials:  Doxycycline 11/12  Subjective: Pt would like breakfast but otherwise has back pain but no other complaints.  TLSO brace is on.    Objective: Vitals:   04/14/18 1932 04/14/18 2130 04/15/18 0636 04/15/18 0746  BP:  (!) 147/78 137/87   Pulse: 88 99 75   Resp: 18 15 16    Temp:  97.9 F (36.6 C) 98.7 F (37.1 C)   TempSrc:  Oral Oral   SpO2: 98% 95% 96% 94%  Weight:        Height:        Intake/Output Summary (Last 24 hours) at 04/15/2018 1321 Last data filed at 04/15/2018 0930 Gross per 24 hour  Intake 356 ml  Output -  Net 356 ml   Filed Weights   04/13/18 2218  Weight: 68.5 kg   REVIEW OF SYSTEMS  As per history otherwise all reviewed and reported negative  Exam:  General exam: thin, confused male, NAD, TLSO brace is on patient. Repaired laceration over right eye.  Respiratory system: Clear. No increased work of breathing. Cardiovascular system: S1 & S2 heard, RRR. No JVD, murmurs, gallops, clicks or pedal edema. Gastrointestinal system: Abdomen is nondistended, soft and nontender. Normal bowel sounds heard. Central nervous system: Alert and confused at times but able to orient to person and place. No focal neurological deficits. Extremities: no CCE.  Data Reviewed: Basic Metabolic Panel: Recent Labs  Lab 04/13/18 2243 04/14/18 1830 04/15/18 0447  NA 135  --  140  K 3.1*  --  4.0  CL 102  --  107  CO2 25  --  25  GLUCOSE 103*  --  73  BUN 14  --  9  CREATININE 0.81  --  0.76  CALCIUM 8.1*  --  8.5*  MG  --  2.3 2.3   Liver Function Tests: Recent Labs  Lab 04/13/18 2243 04/15/18 0447  AST  19 14*  ALT 11 9  ALKPHOS 102 98  BILITOT 0.7 0.7  PROT 6.8 6.3*  ALBUMIN 3.1* 3.0*   No results for input(s): LIPASE, AMYLASE in the last 168 hours. No results for input(s): AMMONIA in the last 168 hours. CBC: Recent Labs  Lab 04/13/18 2243 04/15/18 0447  WBC 12.3* 7.3  NEUTROABS  --  4.9  HGB 12.1* 11.5*  HCT 38.4* 36.9*  MCV 88.9 90.9  PLT 375 435*   Cardiac Enzymes: No results for input(s): CKTOTAL, CKMB, CKMBINDEX, TROPONINI in the last 168 hours. CBG (last 3)  Recent Labs    04/13/18 2246  GLUCAP 98   No results found for this or any previous visit (from the past 240 hour(s)).   Studies: Dg Chest 1 View  Result Date: 04/14/2018 CLINICAL DATA:  Falling confusion. EXAM: CHEST  1 VIEW COMPARISON:  March 25, 2018 FINDINGS: Healed bilateral rib fractures. The heart, hila, mediastinum, lungs, and pleura are otherwise unremarkable. IMPRESSION: No active disease. Electronically Signed   By: Gerome Sam III M.D   On: 04/14/2018 02:57   Dg Thoracic Spine 2 View  Result Date: 04/14/2018 CLINICAL DATA:  Back pain after fall. EXAM: THORACIC SPINE 2 VIEWS COMPARISON:  Chest x-ray June 15, 2017 FINDINGS: The T7 compression fracture is stable. And L1 compression fracture was not definitely seen previously. However, the previous studies were limited due to level penetration. No other fractures. IMPRESSION: 1. There is a compression fracture of L1 which was not seen previously and may be acute. Recommend clinical correlation. A CT scan or MRI could further assess the L1 level if clinically warranted. 2. Stable T7 compression fraction. Electronically Signed   By: Gerome Sam III M.D   On: 04/14/2018 00:35   Dg Lumbar Spine Complete  Result Date: 04/14/2018 CLINICAL DATA:  Pain after fall EXAM: LUMBAR SPINE - COMPLETE 4+ VIEW COMPARISON:  None. FINDINGS: There is a compression fracture of L1 with approximately 30% loss of anterior height. No other fractures or malalignment. Mild multilevel degenerative disc disease. IMPRESSION: 1. There is a compression fracture of L1 with approximately 30% loss of anterior height. This region was not well evaluated on previous chest x-rays but was not definitely seen. An acute L1 compression fracture is not excluded. Electronically Signed   By: Gerome Sam III M.D   On: 04/14/2018 00:36   Ct Head Wo Contrast  Result Date: 04/13/2018 CLINICAL DATA:  Status post fall out of bed, with laceration at the right eyebrow. Neck pain. Initial encounter. EXAM: CT HEAD WITHOUT CONTRAST CT CERVICAL SPINE WITHOUT CONTRAST TECHNIQUE: Multidetector CT imaging of the head and cervical spine was performed following the standard protocol without intravenous contrast. Multiplanar CT image  reconstructions of the cervical spine were also generated. COMPARISON:  CT of the head performed 03/25/2018, and CT of the cervical spine performed 06/15/2017 FINDINGS: CT HEAD FINDINGS Brain: No evidence of acute infarction, hemorrhage, hydrocephalus, extra-axial collection or mass lesion / mass effect. Prominence of the ventricles and sulci reflects mild cortical volume loss. Diffuse periventricular subcortical white matter change likely reflects small vessel ischemic microangiopathy. Chronic ischemic change is noted at the basal ganglia bilaterally, and at the left thalamus. The brainstem and fourth ventricle are within normal limits. The cerebral hemispheres demonstrate grossly normal gray-white differentiation. No mass effect or midline shift is seen. Vascular: No hyperdense vessel or unexpected calcification. Skull: There is no evidence of fracture; visualized osseous structures are unremarkable in appearance. Sinuses/Orbits: The visualized  portions of the orbits are within normal limits. The paranasal sinuses and mastoid air cells are well-aerated. Other: No significant soft tissue abnormalities are seen. CT CERVICAL SPINE FINDINGS Alignment: Normal. Skull base and vertebrae: No acute fracture. No primary bone lesion or focal pathologic process. There appears to be a chronic incompletely healed fracture at the right posterior first rib. Soft tissues and spinal canal: No prevertebral fluid or swelling. No visible canal hematoma. Disc levels: Mild intervertebral disc space narrowing is noted at C5-C6, with anterior and posterior disc osteophyte complexes. Mild degenerative change is noted about the dens. Upper chest: Mild vague nonspecific hypodensity is noted at the left thyroid lobe. The visualized portions of the thyroid gland are otherwise unremarkable. The visualized lung apices are clear. Mild calcification is noted at the carotid bifurcations bilaterally. Other: No additional soft tissue abnormalities  are seen. IMPRESSION: 1. No evidence of traumatic intracranial injury or fracture. 2. No evidence of fracture or subluxation along the cervical spine. 3. Mild cortical volume loss and diffuse small vessel ischemic microangiopathy. 4. Chronic ischemic change at the basal ganglia bilaterally, and at the left thalamus. 5. Mild degenerative change at the lower cervical spine. 6. Mild calcification at the carotid bifurcations bilaterally. Carotid ultrasound could be considered for further evaluation, when and as deemed clinically appropriate. Electronically Signed   By: Roanna Raider M.D.   On: 04/13/2018 23:59   Ct Cervical Spine Wo Contrast  Result Date: 04/13/2018 CLINICAL DATA:  Status post fall out of bed, with laceration at the right eyebrow. Neck pain. Initial encounter. EXAM: CT HEAD WITHOUT CONTRAST CT CERVICAL SPINE WITHOUT CONTRAST TECHNIQUE: Multidetector CT imaging of the head and cervical spine was performed following the standard protocol without intravenous contrast. Multiplanar CT image reconstructions of the cervical spine were also generated. COMPARISON:  CT of the head performed 03/25/2018, and CT of the cervical spine performed 06/15/2017 FINDINGS: CT HEAD FINDINGS Brain: No evidence of acute infarction, hemorrhage, hydrocephalus, extra-axial collection or mass lesion / mass effect. Prominence of the ventricles and sulci reflects mild cortical volume loss. Diffuse periventricular subcortical white matter change likely reflects small vessel ischemic microangiopathy. Chronic ischemic change is noted at the basal ganglia bilaterally, and at the left thalamus. The brainstem and fourth ventricle are within normal limits. The cerebral hemispheres demonstrate grossly normal gray-white differentiation. No mass effect or midline shift is seen. Vascular: No hyperdense vessel or unexpected calcification. Skull: There is no evidence of fracture; visualized osseous structures are unremarkable in appearance.  Sinuses/Orbits: The visualized portions of the orbits are within normal limits. The paranasal sinuses and mastoid air cells are well-aerated. Other: No significant soft tissue abnormalities are seen. CT CERVICAL SPINE FINDINGS Alignment: Normal. Skull base and vertebrae: No acute fracture. No primary bone lesion or focal pathologic process. There appears to be a chronic incompletely healed fracture at the right posterior first rib. Soft tissues and spinal canal: No prevertebral fluid or swelling. No visible canal hematoma. Disc levels: Mild intervertebral disc space narrowing is noted at C5-C6, with anterior and posterior disc osteophyte complexes. Mild degenerative change is noted about the dens. Upper chest: Mild vague nonspecific hypodensity is noted at the left thyroid lobe. The visualized portions of the thyroid gland are otherwise unremarkable. The visualized lung apices are clear. Mild calcification is noted at the carotid bifurcations bilaterally. Other: No additional soft tissue abnormalities are seen. IMPRESSION: 1. No evidence of traumatic intracranial injury or fracture. 2. No evidence of fracture or subluxation along the cervical  spine. 3. Mild cortical volume loss and diffuse small vessel ischemic microangiopathy. 4. Chronic ischemic change at the basal ganglia bilaterally, and at the left thalamus. 5. Mild degenerative change at the lower cervical spine. 6. Mild calcification at the carotid bifurcations bilaterally. Carotid ultrasound could be considered for further evaluation, when and as deemed clinically appropriate. Electronically Signed   By: Roanna Raider M.D.   On: 04/13/2018 23:59   Ct Lumbar Spine Wo Contrast  Result Date: 04/14/2018 CLINICAL DATA:  Initial evaluation for acute trauma, fall. Lower back pain. EXAM: CT LUMBAR SPINE WITHOUT CONTRAST TECHNIQUE: Multidetector CT imaging of the lumbar spine was performed without intravenous contrast administration. Multiplanar CT image  reconstructions were also generated. COMPARISON:  Prior radiographs from 04/13/2018. FINDINGS: Segmentation: Normal segmentation. Lowest well-formed disc labeled the L5-S1 level. Alignment: Trace retrolisthesis of L5 on S1. Mild levoscoliosis. Alignment otherwise normal with preservation of the normal lumbar lordosis. Vertebrae: Acute burst type compression fracture extends through the superior endplate of L1. Associated height loss of up to 40% with 3 mm bony retropulsion. Vertebral body heights otherwise maintained. No other acute fracture identified. Visualized sacrum and pelvis intact. SI joints approximated symmetric. Remotely healed fractures of the right posterior eleventh and twelfth ribs noted. Additional remotely healed fractures of the right transverse processes of L1, L2, L3, and L4. Remotely healed fracture of the left transverse process of L4. No discrete lytic or blastic osseous lesions. Paraspinal and other soft tissues: Paraspinous soft tissues demonstrate no acute finding. Advanced aorto bi-iliac atherosclerotic disease. Visualized visceral structures otherwise unremarkable. Disc levels: T12-L1: 3 mm bony retropulsion related to the acute L1 compression fracture. Mild disc desiccation. Mild bilateral facet hypertrophy. Central canal remains widely patent as do the bilateral neural foramina. L1-2:  Unremarkable. L2-3: Minimal disc bulge. Mild facet hypertrophy. No significant stenosis. L3-4: Diffuse disc bulge, asymmetric to the right. Moderate facet and ligamentum flavum hypertrophy, greater on the right. Resultant moderate canal with bilateral subarticular stenosis, right greater than left. Moderate right with mild left L3 foraminal narrowing. L4-5: Chronic intervertebral disc space narrowing with disc desiccation. Superimposed broad right foraminal/extraforaminal disc protrusion (series 4, image 8). Protruding disc closely approximates the exiting right L4 nerve root as it courses of the left  neural foramen. Additional disc bulge seen at the level of the left lateral recess extending laterally towards the left neural foramen (series 4, image 82). Moderate to advanced facet hypertrophy, right greater than left. Resultant moderate canal with right greater than left lateral recess stenosis. Moderate right with mild to moderate left L4 foraminal narrowing. L5-S1: Chronic intervertebral disc space narrowing with diffuse disc bulge and disc desiccation. Reactive endplate changes with marginal endplate osteophytic spurring. Mild to moderate facet hypertrophy. No significant spinal stenosis. Mild to moderate bilateral L5 foraminal narrowing. IMPRESSION: 1. Acute burst type compression fracture involving the superior endplate of L1 with associated 40% height loss and 3 mm bony retropulsion. No significant stenosis. 2. No other acute traumatic injury within the lumbar spine. 3. Chronic remotely healed fractures involving the right posterior eleventh and twelfth ribs as well as the right transverse processes of L1 through L4, along with the left transverse process of L4. 4. Moderate sized right foraminal/extraforaminal disc protrusion at L4-5, closely approximating and potentially affecting the exiting right L4 nerve root. 5. Disc bulge with facet hypertrophy at L3-4 and L4-5 with resultant moderate canal and bilateral subarticular stenosis as above. Electronically Signed   By: Rise Mu M.D.   On: 04/14/2018 01:11  US Carotid Bilateral  Result Date: 04/15/2018 CLINICAL DATA:  62 year old male with frequent falls and a history of carotid artery disease. EXAM: BILATERAL CAROTID DUPLEX ULTRASOUND TECHNIQUE: Wallace Cullens scale imaging, color Doppler and duplex ultrasound were performed of bilateral carotid and vertebral arteries in the neck. COMPARISON:  None. FINDINGS: Criteria: Quantification of carotid stenosis is based on velocity parameters that correlate the residual internal carotid diameter with  NASCET-based stenosis levels, using the diameter of the distal internal carotid lumen as the denominator for stenosis measurement. The following velocity measurements were obtained: RIGHT ICA: 88/32 cm/sec CCA: 77/18 cm/sec SYSTOLIC ICA/CCA RATIO:  1.1 ECA:  105 cm/sec LEFT ICA: 90/35 cm/sec CCA: 60/16 cm/sec SYSTOLIC ICA/CCA RATIO:  1.5 ECA:  69 cm/sec RIGHT CAROTID ARTERY: Smooth heterogeneous atherosclerotic plaque in the proximal internal carotid artery. By peak systolic velocity criteria, the estimated stenosis remains less than 50%. RIGHT VERTEBRAL ARTERY:  Patent with normal antegrade flow. LEFT CAROTID ARTERY: Smooth heterogeneous atherosclerotic plaque in the proximal internal carotid artery. By peak systolic velocity criteria, the estimated stenosis remains less than 50%. LEFT VERTEBRAL ARTERY:  Patent with normal antegrade flow. IMPRESSION: 1. Mild (1-49%) stenosis proximal right internal carotid artery secondary to heterogenous atherosclerotic plaque. 2. Mild (1-49%) stenosis proximal left internal carotid artery secondary to heterogenous atherosclerotic plaque. 3. Vertebral arteries are patent with normal antegrade flow. Electronically Signed   By: Malachy Moan M.D.   On: 04/15/2018 13:02   Scheduled Meds: . docusate sodium  100 mg Oral BID  . donepezil  5 mg Oral QHS  . doxycycline  100 mg Oral Q12H  . ipratropium-albuterol  3 mL Nebulization Q6H  . meclizine  25 mg Oral QHS  . OLANZapine  2.5 mg Oral QHS  . pantoprazole  40 mg Oral BID AC  . potassium chloride SA  40 mEq Oral BID  . simvastatin  20 mg Oral Daily  . vitamin B-12  500 mcg Oral Daily   Continuous Infusions: . sodium chloride 35 mL/hr at 04/14/18 2224    Principal Problem:   Compression fracture of L1 lumbar vertebra (HCC) Active Problems:   Rectal bleed   CVA (cerebral vascular accident) (HCC)   Melena   Vascular dementia with behavioral disturbance (HCC)   Falls frequently   Facial laceration    Hypokalemia   Leukocytosis   Hypertension  Time spent:   Standley Dakins, MD, FAAFP [facility] Pager [phone]  If 7PM-7AM, please contact night-coverage [email or web address] Password [email or web address] 04/15/2018, 1:21 PM    LOS: 1 day

## 2018-04-15 NOTE — Progress Notes (Signed)
Upon giving patient bath, it was noted that there was a wound on the patients back at the site of the edge of his back brace.  The wound was cleaned and a foam dressing was applied.

## 2018-04-15 NOTE — Plan of Care (Signed)
  Problem: Clinical Measurements: Goal: Respiratory complications will improve Outcome: Progressing   Problem: Nutrition: Goal: Adequate nutrition will be maintained Outcome: Progressing   Problem: Elimination: Goal: Will not experience complications related to bowel motility Outcome: Progressing   Problem: Pain Managment: Goal: General experience of comfort will improve Outcome: Progressing   Problem: Skin Integrity: Goal: Risk for impaired skin integrity will decrease Outcome: Progressing

## 2018-04-16 DIAGNOSIS — J209 Acute bronchitis, unspecified: Secondary | ICD-10-CM | POA: Diagnosis present

## 2018-04-16 DIAGNOSIS — S0181XD Laceration without foreign body of other part of head, subsequent encounter: Secondary | ICD-10-CM | POA: Diagnosis not present

## 2018-04-16 DIAGNOSIS — R296 Repeated falls: Secondary | ICD-10-CM | POA: Diagnosis not present

## 2018-04-16 DIAGNOSIS — I10 Essential (primary) hypertension: Secondary | ICD-10-CM | POA: Diagnosis not present

## 2018-04-16 DIAGNOSIS — S32010D Wedge compression fracture of first lumbar vertebra, subsequent encounter for fracture with routine healing: Secondary | ICD-10-CM | POA: Diagnosis not present

## 2018-04-16 NOTE — Clinical Social Work Note (Signed)
[facility] who informed me [facility] medical need.  She also informed me that patient is not service connected.

## 2018-04-16 NOTE — Progress Notes (Signed)
Nurse responded to pt's bed alarm going off. Pt found sitting in floor on fall mat, legs crisscrossed. Pt had large BM on floor and bed pad. Pt's side rail was up at the time that patient was sitting in floor. Pt had on yellow fall socks, fall bracelet and yellow light on above patient's door as he was considered high fall risk-admitted as result of fall. Pt has no apparent injuries. Pt unable to tell RN if he hit his head. Pt is alert and oriented to himself only, same as prior to fall. Pt only follows commands at times, same as prior to fall. Attempted to call son, Maurine MinisterDennis, per number in chart without answer. Dr. Laural BenesJohnson paged and made aware. Pt returned to bed with bed alarm on medium sensitivity.

## 2018-04-16 NOTE — Clinical Social Work Note (Signed)
Found out yesterday in follow[facility] call [facility] that [facility] is out for a [facility].  Called back-up number for Roberto Fitzpatrick [phone] and left a message yesterday PM.  Called again this AM and left another message.

## 2018-04-16 NOTE — Clinical Social Work Note (Signed)
[facility].  She will review information and c/b.

## 2018-04-16 NOTE — Clinical Social Work Note (Signed)
[name] called back to say that patient is in[facility] for [facility] and [facility] because he is not service connected. Will staff with team to determine other, if any, options.

## 2018-04-16 NOTE — Progress Notes (Signed)
Physical Therapy Treatment Patient Details Name: Roberto Fitzpatrick MRN: [id number] DOB: 04/01/1956 Today's Date: 04/16/2018    History of Present Illness Roberto Fitzpatrick is a 62 y.o. 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  He apparently had 5 witnessed falls today and sustained a laceration over his eye and had been complaining of back and neck pain.  He was recently admitted for a GI bleed but he declined to allow GI to do the endoscopy that they recommended and [facility]  [facility].  His family says that they cannot care for him safely at home as he continues to have serious falls and his dementia continues to get worse.  He was found to have some mild dehydration and hypokalemia in ED and admission was requested.  Social worker has been consulted to work on SNF placement for patient.     PT Comments    Patient required Max assist to don TLSO, patient present with flat affect, able to follow directions with verbal/tactile cueing, answered most questions appropriately, demonstrates slow labored cadence with tendency to drag RLE requiring constant verbal cues to take longer steps and put back to bed after therapy with bed rails up and alarm set.  Patient will benefit from continued physical therapy in hospital and recommended venue below to increase strength, balance, endurance for safe ADLs and gait.    Follow Up Recommendations  SNF     Equipment Recommendations  None recommended by PT    Recommendations for Other Services       Precautions / Restrictions Precautions Precautions: Fall Required Braces or Orthoses: Spinal Brace Spinal Brace: Thoracolumbosacral orthotic(on at all times) Restrictions Weight Bearing Restrictions: No    Mobility  Bed Mobility Overal bed mobility: Needs Assistance Bed Mobility: Supine to  Sit;Sit to Supine     Supine to sit: Min assist Sit to supine: Min assist   General bed mobility comments: slow labored movement  Transfers Overall transfer level: Needs assistance Equipment used: Rolling walker (2 wheeled) Transfers: Sit to/from [employer]Stand;Stand Pivot Transfers Sit to Stand: Mod assist Stand pivot transfers: Mod assist       General transfer comment: requires max verbal/tactile cueing  Ambulation/Gait Ambulation/Gait assistance: Mod assist Gait Distance (Feet): 35 Feet Assistive device: Rolling walker (2 wheeled) Gait Pattern/deviations: Decreased step length - right;Decreased step length - left;Decreased stride length Gait velocity: decreased   General Gait Details: slow labored cadence with tendency to drag right foot requiring verbal cues to take longer steps, limited secondary to fatigue, poor balance   Stairs             Wheelchair Mobility    Modified Rankin (Stroke Patients Only)       Balance Overall balance assessment: Needs assistance Sitting-balance support: Feet supported;No upper extremity supported Sitting balance-Leahy Scale: Fair     Standing balance support: Bilateral upper extremity supported;During functional activity Standing balance-Leahy Scale: Poor Standing balance comment: fair/poor with RW                            Cognition Arousal/Alertness: Awake/alert Behavior During Therapy: Restless;Impulsive Overall Cognitive Status: Impaired/Different from baseline Area of Impairment: Attention;Safety/judgement;Awareness  Current Attention Level: Sustained     Safety/Judgement: Decreased awareness of safety;Decreased awareness of deficits     General Comments: able to follow directs with repeated verbal/tactile cueing      Exercises      General Comments        Pertinent Vitals/Pain Pain Assessment: Faces Faces Pain Scale: Hurts little more Pain Location: low back Pain  Descriptors / Indicators: Aching Pain Intervention(s): Limited activity within patient's tolerance;Monitored during session;Premedicated before session    Home Living                      Prior Function            PT Goals (current goals can now be found in the care plan section) Acute Rehab PT Goals Patient Stated Goal: return home PT Goal Formulation: With patient Time For Goal Achievement: 04/28/18 Potential to Achieve Goals: Fair Progress towards PT goals: Progressing toward goals    Frequency    Min 3X/week      PT Plan Current plan remains appropriate    Co-evaluation              AM-PAC PT "6 Clicks" Daily Activity  Outcome Measure  Difficulty turning over in bed (including adjusting bedclothes, sheets and blankets)?: Unable Difficulty moving from lying on back to sitting on the side of the bed? : Unable Difficulty sitting down on and standing up from a chair with arms (e.g., wheelchair, bedside commode, etc,.)?: Unable Help needed moving to and from a bed to chair (including a wheelchair)?: A Lot Help needed walking in hospital room?: A Lot Help needed climbing 3-5 steps with a railing? : A Lot 6 Click Score: 9    End of Session Equipment Utilized During Treatment: Gait belt;Back brace Activity Tolerance: Patient tolerated treatment well;Patient limited by fatigue Patient left: in bed;with call bell/phone within reach;with bed alarm set Nurse Communication: Mobility status PT Visit Diagnosis: Unsteadiness on feet (R26.81);Other abnormalities of gait and mobility (R26.89);Muscle weakness (generalized) (M62.81)     Time: 1025-1050 PT Time Calculation (min) (ACUTE ONLY): 25 min  Charges:  $Therapeutic Activity: 23-37 mins                     2:19 PM, 04/16/18 [name], MPT Physical Therapist with [facility] [phone] office [phone] mobile phone

## 2018-04-16 NOTE — Progress Notes (Signed)
PROGRESS NOTE  Roberto Fitzpatrick  UJW:[id number]  DOB: 01/11/1956  DOA: 04/13/2018 PCP: Patient, No Pcp Per  Brief Admission Hx: Roberto Fitzpatrick is a 62 y.o. 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  He apparently had 5 witnessed falls today and sustained a laceration over his eye and had been complaining of back and neck pain.   MDM/Assessment & Plan: 1. Acute L1 vertebral compression fracture - Pt has been placed in a TLSO brace and consulted orthopedics.  Pain management ordered. Fall precautions.   PT recommended SNF rehab.  Social workers assisting with placement.  2. GI bleed - Pt had refused GI work up at last recent admission less than 2 weeks ago. Hg remains stable.  Follow.  [facility]. Continue protonix.   Pt still refusing work up in the hospital.  His Hg fairly stable so far.  Monitor.  3. Carotid arterial disease - carotid doppler with no significant stenosis found.   4. Frequent falls- fall precautions, PT recommending SNF.  5. Leukocytosis - Pt appears to have an acute bronchitis/URI - will prescribe doxycycline 100 mg BID and follow clinically.   Continue scheduled nebs.   6. Essential hypertension - resume home medications and follow.  His blood pressure has been controlled.  7. Hypokalemia - potassium has been repleted.   DVT Prophylaxis: SCDs Code Status: Full   Family Communication: telephone   Disposition Plan: SNF when bed available    Consultants:  orthopedics  Procedures:  N/A  Antimicrobials:  Doxycycline 11/12  Subjective: Pt confused at times but is complaining of back pain.    Objective: Vitals:   04/15/18 2012 04/15/18 2142 04/16/18 0500 04/16/18 0752  BP:  (!) 154/91 129/84   Pulse:  90 81   Resp:  20 14   Temp:  97.6 F (36.4 C) 98 F (36.7 C)   TempSrc:  Oral Oral   SpO2: 93% 98% 100% 94%  Weight:      Height:         Intake/Output Summary (Last 24 hours) at 04/16/2018 1140 Last data filed at 04/15/2018 2315 Gross per 24 hour  Intake 651.62 ml  Output 400 ml  Net 251.62 ml   Filed Weights   04/13/18 2218  Weight: 68.5 kg   REVIEW OF SYSTEMS  As per history otherwise all reviewed and reported negative  Exam:  General exam: thin, confused male, NAD, TLSO brace is on patient. Repaired laceration over right eye.  Respiratory system: Clear. No increased work of breathing. Cardiovascular system: S1 & S2 heard, RRR. No JVD, murmurs, gallops, clicks or pedal edema. Gastrointestinal system: Abdomen is nondistended, soft and nontender. Normal bowel sounds heard. Central nervous system: Alert and confused at times but able to orient to person and place. No focal neurological deficits. Extremities: no CCE.  Data Reviewed: Basic Metabolic Panel: Recent Labs  Lab 04/13/18 2243 04/14/18 1830 04/15/18 0447  NA 135  --  140  K 3.1*  --  4.0  CL 102  --  107  CO2 25  --  25  GLUCOSE 103*  --  73  BUN 14  --  9  CREATININE 0.81  --  0.76  CALCIUM 8.1*  --  8.5*  MG  --  2.3 2.3   Liver Function Tests: Recent Labs  Lab 04/13/18 2243 04/15/18 0447  AST 19  14*  ALT 11 9  ALKPHOS 102 98  BILITOT 0.7 0.7  PROT 6.8 6.3*  ALBUMIN 3.1* 3.0*   No results for input(s): LIPASE, AMYLASE in the last 168 hours. No results for input(s): AMMONIA in the last 168 hours. CBC: Recent Labs  Lab 04/13/18 2243 04/15/18 0447  WBC 12.3* 7.3  NEUTROABS  --  4.9  HGB 12.1* 11.5*  HCT 38.4* 36.9*  MCV 88.9 90.9  PLT 375 435*   Cardiac Enzymes: No results for input(s): CKTOTAL, CKMB, CKMBINDEX, TROPONINI in the last 168 hours. CBG (last 3)  Recent Labs    04/13/18 2246  GLUCAP 98   No results found for this or any previous visit (from the past 240 hour(s)).   Studies: US Carotid Bilateral  Result Date: 04/15/2018 CLINICAL DATA:  62 year old male with frequent falls and a history of carotid  artery disease. EXAM: BILATERAL CAROTID DUPLEX ULTRASOUND TECHNIQUE: Wallace Cullens scale imaging, color Doppler and duplex ultrasound were performed of bilateral carotid and vertebral arteries in the neck. COMPARISON:  None. FINDINGS: Criteria: Quantification of carotid stenosis is based on velocity parameters that correlate the residual internal carotid diameter with NASCET-based stenosis levels, using the diameter of the distal internal carotid lumen as the denominator for stenosis measurement. The following velocity measurements were obtained: RIGHT ICA: 88/32 cm/sec CCA: 77/18 cm/sec SYSTOLIC ICA/CCA RATIO:  1.1 ECA:  105 cm/sec LEFT ICA: 90/35 cm/sec CCA: 60/16 cm/sec SYSTOLIC ICA/CCA RATIO:  1.5 ECA:  69 cm/sec RIGHT CAROTID ARTERY: Smooth heterogeneous atherosclerotic plaque in the proximal internal carotid artery. By peak systolic velocity criteria, the estimated stenosis remains less than 50%. RIGHT VERTEBRAL ARTERY:  Patent with normal antegrade flow. LEFT CAROTID ARTERY: Smooth heterogeneous atherosclerotic plaque in the proximal internal carotid artery. By peak systolic velocity criteria, the estimated stenosis remains less than 50%. LEFT VERTEBRAL ARTERY:  Patent with normal antegrade flow. IMPRESSION: 1. Mild (1-49%) stenosis proximal right internal carotid artery secondary to heterogenous atherosclerotic plaque. 2. Mild (1-49%) stenosis proximal left internal carotid artery secondary to heterogenous atherosclerotic plaque. 3. Vertebral arteries are patent with normal antegrade flow. Electronically Signed   By: Malachy Moan M.D.   On: 04/15/2018 13:02   Scheduled Meds: . docusate sodium  100 mg Oral BID  . donepezil  5 mg Oral QHS  . doxycycline  100 mg Oral Q12H  . ipratropium-albuterol  3 mL Nebulization TID  . meclizine  25 mg Oral QHS  . OLANZapine  2.5 mg Oral QHS  . pantoprazole  40 mg Oral BID AC  . simvastatin  20 mg Oral Daily  . vitamin B-12  500 mcg Oral Daily   Continuous  Infusions:   Principal Problem:   Compression fracture of L1 lumbar vertebra (HCC) Active Problems:   Rectal bleed   Melena   Vascular dementia with behavioral disturbance (HCC)   Falls frequently   Facial laceration   Hypokalemia   Leukocytosis   Hypertension  Time spent:   Standley Dakins, MD, FAAFP [facility] Pager [phone]  If 7PM-7AM, please contact night-coverage [email or web address] Password [email or web address] 04/16/2018, 11:40 AM    LOS: 2 days

## 2018-04-17 DIAGNOSIS — J209 Acute bronchitis, unspecified: Secondary | ICD-10-CM

## 2018-04-17 DIAGNOSIS — S0181XD Laceration without foreign body of other part of head, subsequent encounter: Secondary | ICD-10-CM | POA: Diagnosis not present

## 2018-04-17 DIAGNOSIS — R296 Repeated falls: Secondary | ICD-10-CM | POA: Diagnosis not present

## 2018-04-17 DIAGNOSIS — S32010D Wedge compression fracture of first lumbar vertebra, subsequent encounter for fracture with routine healing: Secondary | ICD-10-CM | POA: Diagnosis not present

## 2018-04-17 MED ORDER — OXYCODONE HCL 5 MG PO TABS: 2.5000 mg | ORAL_TABLET | Freq: Four times a day (QID) | ORAL | 0 refills | Status: AC | PRN

## 2018-04-17 MED ORDER — OLANZAPINE 2.5 MG PO TABS: 2.5000 mg | ORAL_TABLET | Freq: Every day | ORAL | 0 refills | Status: AC

## 2018-04-17 MED ORDER — DOXYCYCLINE HYCLATE 100 MG PO TABS: 100.0000 mg | ORAL_TABLET | Freq: Two times a day (BID) | ORAL | 0 refills | Status: AC

## 2018-04-17 MED ORDER — DOCUSATE SODIUM 100 MG PO CAPS: 100.0000 mg | ORAL_CAPSULE | Freq: Every day | ORAL | 0 refills | Status: AC | PRN

## 2018-04-17 MED ORDER — ACETAMINOPHEN 325 MG PO TABS: 650.0000 mg | ORAL_TABLET | Freq: Four times a day (QID) | ORAL | Status: AC | PRN

## 2018-04-17 NOTE — Discharge Summary (Signed)
Physician Discharge Summary  Roberto RilyJohn W Purkey ZOX:[id number] DOB: 03/28/1956 DOA: 04/13/2018  [facility]: [facility]  Admit date: 04/13/2018 Discharge date: 04/17/2018  Admitted From: Home  Disposition: Home   Recommendations for Outpatient Follow-up:  1. Follow up with PCP in 1 weeks 2. PLEASE FOLLOW UP WITH NEUROSURGERY IN 1 WEEK 3. Follow up with GI for outpatient EGD as recommended from last admission  Mr. [name] has a fracture of his first lumbar vertebrae.  He will be fitted with a brace to support his back.  He was prescribed pain medication.  He will need to follow-up with a back specialist to make sure he is healing okay.  [facility] or you can call Dr. Jeni SallesMeyron, the neurosurgeon on-call.  He should be rechecked if he is unable to urinate.  He should be rechecked if he has any problems listed on the head injury sheet.    The sutures need to be removed in 2-3 days.  Please take him to any clinic or urgent care or his primary care provider to have them removed.    WEAR TLSO BRACE AT ALL TIMES UNTIL YOU FOLLOW UP WITH NEUROSURGERY OFFICE.   Seek medical care or return to ER if symptoms worsen, don't improve or new problem develops.   OUTPATIENT P.T. ARRANGED  Discharge Condition: STABLE   CODE STATUS: FULL    Brief Hospitalization Summary: Please see all hospital notes, images, labs for full details of the hospitalization. HPI: Roberto Fitzpatrick is a 62 y.o. 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  He apparently had 5 witnessed falls today and sustained a laceration over his eye and had been complaining of back and neck pain.  He was recently admitted for a GI bleed but he declined to allow GI to do the endoscopy that they recommended and [facility].  [facility] with the [facility] at [facility].  His family says that they cannot care  for him safely at home as he continues to have serious falls and his dementia continues to get worse.  He was found to have some mild dehydration and hypokalemia in ED and admission was requested.  Social worker has been consulted to work on SNF placement for patient.   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 He apparently had 5 witnessed falls today and sustained a laceration over his eye and had been complaining of back and neck pain.   MDM/Assessment & Plan: 1. Acute L1 vertebral compression fracture - Pt has been placed in a TLSO brace and consulted orthopedics. Pain management ordered. Fall precautions.  PT recommended SNF rehab.  Social workers assisting with placement. PT TO FOLLOW  UP WITH NEUROSURGERY IN 1 WEEK FOR RECHECK.  PT TO WEAR BRACE AT ALL TIMES UNTIL HE FOLLOWS UP WITH NEUROSURGERY CLINIC.  2. RECENT GI bleed - Pt had refused GI work up at last recent admission less than 2 weeks ago. Hg remains stable. Follow. [facility]. Continue protonix.  Pt still refusing work up in the hospital.  His Hg fairly stable so far.  OUTPATIENT GI FOLLOW UP RECOMMENDED.  3. Carotid arterial disease - carotid  doppler with no significant stenosis found.   4. Frequent falls- fall precautions, PT recommending SNF but patient did not have resources for SNF. Outpatient PT arranged and patient going home with 24/7 care from brother and care takers arranged by family.  Please see social worker and [employer] notes.  [facility] referral has been made for patient.  5. Leukocytosis - Pt appears to have an acute bronchitis/URI - prescribe doxycycline 100 mg BID x 5 days.  He is clinically much better.    6. Essential hypertension - resume home medications and follow.  His blood pressure has been controlled.  7. Hypokalemia - potassium has been  repleted.  DVT Prophylaxis:SCDs Code Status:Full Family Communication:telephone Disposition Plan:HOME WITH BROTHER CARING FOR HIM 24/7  Consultants:  orthopedics  Procedures:  N/A  Antimicrobials:  Doxycycline 11/12   Discharge Diagnoses:  Principal Problem:   Compression fracture of L1 lumbar vertebra (HCC) Active Problems:   Rectal bleed   Melena   Vascular dementia with behavioral disturbance (HCC)   Falls frequently   Facial laceration   Hypokalemia   Leukocytosis   Hypertension   Acute bronchitis    Discharge Instructions: Discharge Instructions    Ambulatory referral to Physical Therapy   Complete by:  As directed    Call MD for:  difficulty breathing, headache or visual disturbances   Complete by:  As directed    Call MD for:  extreme fatigue   Complete by:  As directed    Call MD for:  persistant dizziness or light-headedness   Complete by:  As directed    Call MD for:  persistant nausea and vomiting   Complete by:  As directed    Call MD for:  severe uncontrolled pain   Complete by:  As directed    Increase activity slowly   Complete by:  As directed      Allergies as of 04/17/2018   No Known Allergies     Medication List    TAKE these medications   acetaminophen 325 MG tablet Commonly known as:  TYLENOL Take 2 tablets (650 mg total) by mouth every 6 (six) hours as needed for mild pain (or Fever >/= 101).   docusate sodium 100 MG capsule Commonly known as:  COLACE Take 1 capsule (100 mg total) by mouth daily as needed for mild constipation.   donepezil 10 MG tablet Commonly known as:  ARICEPT Take 5 mg by mouth at bedtime.   doxycycline 100 MG tablet Commonly known as:  VIBRA-TABS Take 1 tablet (100 mg total) by mouth every 12 (twelve) hours for 5 days.   Meclizine HCl 25 MG Chew Chew 25 mg by mouth at bedtime.   OLANZapine 2.5 MG tablet Commonly known as:  ZYPREXA Take 1 tablet (2.5 mg total) by mouth at  bedtime for 15 days.   oxyCODONE 5 MG immediate release tablet Commonly known as:  Oxy IR/ROXICODONE Take 0.5 tablets (2.5 mg total) by mouth every 6 (six) hours as needed for severe pain.   pantoprazole 40 MG tablet Commonly known as:  PROTONIX Take 1 tablet (40 mg total) by mouth 2 (two) times daily before a meal.   simvastatin 20 MG tablet Commonly known as:  ZOCOR Take 20 mg by mouth daily.   vitamin B-12 500 MCG tablet Commonly known as:  CYANOCOBALAMIN Take 500 mcg by mouth daily.      Follow-up Information    Meyran, Tiana Loft, NP. Schedule an appointment as soon as possible for  a visit in 1 week(s).   Specialty:  Neurosurgery Why:  Follow Up for compression fracture Contact information: [address] [place] [address] [phone]        [facility]. Schedule an appointment as soon as possible for a visit in 1 week(s).   Specialty:  General Practice Contact information: [address] [place] [address] [phone]          No Known Allergies Allergies as of 04/17/2018   No Known Allergies     Medication List    TAKE these medications   acetaminophen 325 MG tablet Commonly known as:  TYLENOL Take 2 tablets (650 mg total) by mouth every 6 (six) hours as needed for mild pain (or Fever >/= 101).   docusate sodium 100 MG capsule Commonly known as:  COLACE Take 1 capsule (100 mg total) by mouth daily as needed for mild constipation.   donepezil 10 MG tablet Commonly known as:  ARICEPT Take 5 mg by mouth at bedtime.   doxycycline 100 MG tablet Commonly known as:  VIBRA-TABS Take 1 tablet (100 mg total) by mouth every 12 (twelve) hours for 5 days.   Meclizine HCl 25 MG Chew Chew 25 mg by mouth at bedtime.   OLANZapine 2.5 MG tablet Commonly known as:  ZYPREXA Take 1 tablet (2.5 mg total) by mouth at bedtime for 15 days.   oxyCODONE 5 MG immediate release tablet Commonly known as:  Oxy IR/ROXICODONE Take 0.5  tablets (2.5 mg total) by mouth every 6 (six) hours as needed for severe pain.   pantoprazole 40 MG tablet Commonly known as:  PROTONIX Take 1 tablet (40 mg total) by mouth 2 (two) times daily before a meal.   simvastatin 20 MG tablet Commonly known as:  ZOCOR Take 20 mg by mouth daily.   vitamin B-12 500 MCG tablet Commonly known as:  CYANOCOBALAMIN Take 500 mcg by mouth daily.       Procedures/Studies: Dg Chest 1 View  Result Date: 04/14/2018 CLINICAL DATA:  Falling confusion. EXAM: CHEST  1 VIEW COMPARISON:  March 25, 2018 FINDINGS: Healed bilateral rib fractures. The heart, hila, mediastinum, lungs, and pleura are otherwise unremarkable. IMPRESSION: No active disease. Electronically Signed   By: Gerome Sam III M.D   On: 04/14/2018 02:57   Dg Chest 2 View  Result Date: 03/25/2018 CLINICAL DATA:  Rectal bleeding EXAM: CHEST - 2 VIEW COMPARISON:  06/15/2017 FINDINGS: Hyperinflation. No focal airspace disease or effusion. Normal heart size. No pneumothorax. Multiple old bilateral rib fractures. Old bilateral clavicle fractures. IMPRESSION: No active cardiopulmonary disease. Electronically Signed   By: Jasmine Pang M.D.   On: 03/25/2018 19:53   Dg Thoracic Spine 2 View  Result Date: 04/14/2018 CLINICAL DATA:  Back pain after fall. EXAM: THORACIC SPINE 2 VIEWS COMPARISON:  Chest x-ray June 15, 2017 FINDINGS: The T7 compression fracture is stable. And L1 compression fracture was not definitely seen previously. However, the previous studies were limited due to level penetration. No other fractures. IMPRESSION: 1. There is a compression fracture of L1 which was not seen previously and may be acute. Recommend clinical correlation. A CT scan or MRI could further assess the L1 level if clinically warranted. 2. Stable T7 compression fraction. Electronically Signed   By: Gerome Sam III M.D   On: 04/14/2018 00:35   Dg Lumbar Spine Complete  Result Date: 04/14/2018 CLINICAL  DATA:  Pain after fall EXAM: LUMBAR SPINE - COMPLETE 4+ VIEW  COMPARISON:  None. FINDINGS: There is a compression fracture of L1 with approximately 30% loss of anterior height. No other fractures or malalignment. Mild multilevel degenerative disc disease. IMPRESSION: 1. There is a compression fracture of L1 with approximately 30% loss of anterior height. This region was not well evaluated on previous chest x-rays but was not definitely seen. An acute L1 compression fracture is not excluded. Electronically Signed   By: Gerome Sam III M.D   On: 04/14/2018 00:36   Ct Head Wo Contrast  Result Date: 04/13/2018 CLINICAL DATA:  Status post fall out of bed, with laceration at the right eyebrow. Neck pain. Initial encounter. EXAM: CT HEAD WITHOUT CONTRAST CT CERVICAL SPINE WITHOUT CONTRAST TECHNIQUE: Multidetector CT imaging of the head and cervical spine was performed following the standard protocol without intravenous contrast. Multiplanar CT image reconstructions of the cervical spine were also generated. COMPARISON:  CT of the head performed 03/25/2018, and CT of the cervical spine performed 06/15/2017 FINDINGS: CT HEAD FINDINGS Brain: No evidence of acute infarction, hemorrhage, hydrocephalus, extra-axial collection or mass lesion / mass effect. Prominence of the ventricles and sulci reflects mild cortical volume loss. Diffuse periventricular subcortical white matter change likely reflects small vessel ischemic microangiopathy. Chronic ischemic change is noted at the basal ganglia bilaterally, and at the left thalamus. The brainstem and fourth ventricle are within normal limits. The cerebral hemispheres demonstrate grossly normal gray-white differentiation. No mass effect or midline shift is seen. Vascular: No hyperdense vessel or unexpected calcification. Skull: There is no evidence of fracture; visualized osseous structures are unremarkable in appearance. Sinuses/Orbits: The visualized portions of the orbits  are within normal limits. The paranasal sinuses and mastoid air cells are well-aerated. Other: No significant soft tissue abnormalities are seen. CT CERVICAL SPINE FINDINGS Alignment: Normal. Skull base and vertebrae: No acute fracture. No primary bone lesion or focal pathologic process. There appears to be a chronic incompletely healed fracture at the right posterior first rib. Soft tissues and spinal canal: No prevertebral fluid or swelling. No visible canal hematoma. Disc levels: Mild intervertebral disc space narrowing is noted at C5-C6, with anterior and posterior disc osteophyte complexes. Mild degenerative change is noted about the dens. Upper chest: Mild vague nonspecific hypodensity is noted at the left thyroid lobe. The visualized portions of the thyroid gland are otherwise unremarkable. The visualized lung apices are clear. Mild calcification is noted at the carotid bifurcations bilaterally. Other: No additional soft tissue abnormalities are seen. IMPRESSION: 1. No evidence of traumatic intracranial injury or fracture. 2. No evidence of fracture or subluxation along the cervical spine. 3. Mild cortical volume loss and diffuse small vessel ischemic microangiopathy. 4. Chronic ischemic change at the basal ganglia bilaterally, and at the left thalamus. 5. Mild degenerative change at the lower cervical spine. 6. Mild calcification at the carotid bifurcations bilaterally. Carotid ultrasound could be considered for further evaluation, when and as deemed clinically appropriate. Electronically Signed   By: Roanna Raider M.D.   On: 04/13/2018 23:59   Ct Head Wo Contrast  Result Date: 03/25/2018 CLINICAL DATA:  Altered level of consciousness. EXAM: CT HEAD WITHOUT CONTRAST TECHNIQUE: Contiguous axial images were obtained from the base of the skull through the vertex without intravenous contrast. COMPARISON:  01/20/2018 FINDINGS: Brain: There is atrophy and chronic small vessel disease changes. Left thalamic  lacunar infarct, old. No acute intracranial abnormality. Specifically, no hemorrhage, hydrocephalus, mass lesion, acute infarction, or significant intracranial injury. Vascular: No hyperdense vessel or unexpected calcification. Skull: No acute calvarial  abnormality. Sinuses/Orbits: Visualized paranasal sinuses and mastoids clear. Orbital soft tissues unremarkable. Other: None IMPRESSION: No acute intracranial abnormality. Atrophy, chronic microvascular disease. Old left thalamic lacunar infarct. Electronically Signed   By: Charlett Nose M.D.   On: 03/25/2018 20:34   Ct Cervical Spine Wo Contrast  Result Date: 04/13/2018 CLINICAL DATA:  Status post fall out of bed, with laceration at the right eyebrow. Neck pain. Initial encounter. EXAM: CT HEAD WITHOUT CONTRAST CT CERVICAL SPINE WITHOUT CONTRAST TECHNIQUE: Multidetector CT imaging of the head and cervical spine was performed following the standard protocol without intravenous contrast. Multiplanar CT image reconstructions of the cervical spine were also generated. COMPARISON:  CT of the head performed 03/25/2018, and CT of the cervical spine performed 06/15/2017 FINDINGS: CT HEAD FINDINGS Brain: No evidence of acute infarction, hemorrhage, hydrocephalus, extra-axial collection or mass lesion / mass effect. Prominence of the ventricles and sulci reflects mild cortical volume loss. Diffuse periventricular subcortical white matter change likely reflects small vessel ischemic microangiopathy. Chronic ischemic change is noted at the basal ganglia bilaterally, and at the left thalamus. The brainstem and fourth ventricle are within normal limits. The cerebral hemispheres demonstrate grossly normal gray-white differentiation. No mass effect or midline shift is seen. Vascular: No hyperdense vessel or unexpected calcification. Skull: There is no evidence of fracture; visualized osseous structures are unremarkable in appearance. Sinuses/Orbits: The visualized portions of  the orbits are within normal limits. The paranasal sinuses and mastoid air cells are well-aerated. Other: No significant soft tissue abnormalities are seen. CT CERVICAL SPINE FINDINGS Alignment: Normal. Skull base and vertebrae: No acute fracture. No primary bone lesion or focal pathologic process. There appears to be a chronic incompletely healed fracture at the right posterior first rib. Soft tissues and spinal canal: No prevertebral fluid or swelling. No visible canal hematoma. Disc levels: Mild intervertebral disc space narrowing is noted at C5-C6, with anterior and posterior disc osteophyte complexes. Mild degenerative change is noted about the dens. Upper chest: Mild vague nonspecific hypodensity is noted at the left thyroid lobe. The visualized portions of the thyroid gland are otherwise unremarkable. The visualized lung apices are clear. Mild calcification is noted at the carotid bifurcations bilaterally. Other: No additional soft tissue abnormalities are seen. IMPRESSION: 1. No evidence of traumatic intracranial injury or fracture. 2. No evidence of fracture or subluxation along the cervical spine. 3. Mild cortical volume loss and diffuse small vessel ischemic microangiopathy. 4. Chronic ischemic change at the basal ganglia bilaterally, and at the left thalamus. 5. Mild degenerative change at the lower cervical spine. 6. Mild calcification at the carotid bifurcations bilaterally. Carotid ultrasound could be considered for further evaluation, when and as deemed clinically appropriate. Electronically Signed   By: Roanna Raider M.D.   On: 04/13/2018 23:59   Ct Lumbar Spine Wo Contrast  Result Date: 04/14/2018 CLINICAL DATA:  Initial evaluation for acute trauma, fall. Lower back pain. EXAM: CT LUMBAR SPINE WITHOUT CONTRAST TECHNIQUE: Multidetector CT imaging of the lumbar spine was performed without intravenous contrast administration. Multiplanar CT image reconstructions were also generated. COMPARISON:   Prior radiographs from 04/13/2018. FINDINGS: Segmentation: Normal segmentation. Lowest well-formed disc labeled the L5-S1 level. Alignment: Trace retrolisthesis of L5 on S1. Mild levoscoliosis. Alignment otherwise normal with preservation of the normal lumbar lordosis. Vertebrae: Acute burst type compression fracture extends through the superior endplate of L1. Associated height loss of up to 40% with 3 mm bony retropulsion. Vertebral body heights otherwise maintained. No other acute fracture identified. Visualized sacrum and pelvis  intact. SI joints approximated symmetric. Remotely healed fractures of the right posterior eleventh and twelfth ribs noted. Additional remotely healed fractures of the right transverse processes of L1, L2, L3, and L4. Remotely healed fracture of the left transverse process of L4. No discrete lytic or blastic osseous lesions. Paraspinal and other soft tissues: Paraspinous soft tissues demonstrate no acute finding. Advanced aorto bi-iliac atherosclerotic disease. Visualized visceral structures otherwise unremarkable. Disc levels: T12-L1: 3 mm bony retropulsion related to the acute L1 compression fracture. Mild disc desiccation. Mild bilateral facet hypertrophy. Central canal remains widely patent as do the bilateral neural foramina. L1-2:  Unremarkable. L2-3: Minimal disc bulge. Mild facet hypertrophy. No significant stenosis. L3-4: Diffuse disc bulge, asymmetric to the right. Moderate facet and ligamentum flavum hypertrophy, greater on the right. Resultant moderate canal with bilateral subarticular stenosis, right greater than left. Moderate right with mild left L3 foraminal narrowing. L4-5: Chronic intervertebral disc space narrowing with disc desiccation. Superimposed broad right foraminal/extraforaminal disc protrusion (series 4, image 8). Protruding disc closely approximates the exiting right L4 nerve root as it courses of the left neural foramen. Additional disc bulge seen at the  level of the left lateral recess extending laterally towards the left neural foramen (series 4, image 82). Moderate to advanced facet hypertrophy, right greater than left. Resultant moderate canal with right greater than left lateral recess stenosis. Moderate right with mild to moderate left L4 foraminal narrowing. L5-S1: Chronic intervertebral disc space narrowing with diffuse disc bulge and disc desiccation. Reactive endplate changes with marginal endplate osteophytic spurring. Mild to moderate facet hypertrophy. No significant spinal stenosis. Mild to moderate bilateral L5 foraminal narrowing. IMPRESSION: 1. Acute burst type compression fracture involving the superior endplate of L1 with associated 40% height loss and 3 mm bony retropulsion. No significant stenosis. 2. No other acute traumatic injury within the lumbar spine. 3. Chronic remotely healed fractures involving the right posterior eleventh and twelfth ribs as well as the right transverse processes of L1 through L4, along with the left transverse process of L4. 4. Moderate sized right foraminal/extraforaminal disc protrusion at L4-5, closely approximating and potentially affecting the exiting right L4 nerve root. 5. Disc bulge with facet hypertrophy at L3-4 and L4-5 with resultant moderate canal and bilateral subarticular stenosis as above. Electronically Signed   By: Rise Mu M.D.   On: 04/14/2018 01:11   US Carotid Bilateral  Result Date: 04/15/2018 CLINICAL DATA:  62 year old male with frequent falls and a history of carotid artery disease. EXAM: BILATERAL CAROTID DUPLEX ULTRASOUND TECHNIQUE: Wallace Cullens scale imaging, color Doppler and duplex ultrasound were performed of bilateral carotid and vertebral arteries in the neck. COMPARISON:  None. FINDINGS: Criteria: Quantification of carotid stenosis is based on velocity parameters that correlate the residual internal carotid diameter with NASCET-based stenosis levels, using the diameter of the  distal internal carotid lumen as the denominator for stenosis measurement. The following velocity measurements were obtained: RIGHT ICA: 88/32 cm/sec CCA: 77/18 cm/sec SYSTOLIC ICA/CCA RATIO:  1.1 ECA:  105 cm/sec LEFT ICA: 90/35 cm/sec CCA: 60/16 cm/sec SYSTOLIC ICA/CCA RATIO:  1.5 ECA:  69 cm/sec RIGHT CAROTID ARTERY: Smooth heterogeneous atherosclerotic plaque in the proximal internal carotid artery. By peak systolic velocity criteria, the estimated stenosis remains less than 50%. RIGHT VERTEBRAL ARTERY:  Patent with normal antegrade flow. LEFT CAROTID ARTERY: Smooth heterogeneous atherosclerotic plaque in the proximal internal carotid artery. By peak systolic velocity criteria, the estimated stenosis remains less than 50%. LEFT VERTEBRAL ARTERY:  Patent with normal antegrade flow. IMPRESSION:  1. Mild (1-49%) stenosis proximal right internal carotid artery secondary to heterogenous atherosclerotic plaque. 2. Mild (1-49%) stenosis proximal left internal carotid artery secondary to heterogenous atherosclerotic plaque. 3. Vertebral arteries are patent with normal antegrade flow. Electronically Signed   By: Malachy Moan M.D.   On: 04/15/2018 13:02      Subjective: Pt without complaints. He is able to answer some questions.    Discharge Exam: Vitals:   04/17/18 0740 04/17/18 1415  BP:    Pulse:    Resp:    Temp:    SpO2: 94% 96%   Vitals:   04/16/18 2136 04/17/18 0623 04/17/18 0740 04/17/18 1415  BP: (!) 141/85 (!) 142/99    Pulse: 90 66    Resp:      Temp: 98.2 F (36.8 C) (!) 97.5 F (36.4 C)    TempSrc: Oral Oral    SpO2: 97% 100% 94% 96%  Weight:      Height:       General: Pt is alert, awake, not in acute distress, with dementia Cardiovascular: RRR, S1/S2 +, no rubs, no gallops Respiratory: CTA bilaterally, no wheezing, no rhonchi Abdominal: Soft, NT, ND, bowel sounds + Extremities: no edema, no cyanosis   The results of significant diagnostics from this hospitalization  (including imaging, microbiology, ancillary and laboratory) are listed below for reference.     Microbiology: No results found for this or any previous visit (from the past 240 hour(s)).   Labs: BNP (last 3 results) No results for input(s): BNP in the last 8760 hours. Basic Metabolic Panel: Recent Labs  Lab 04/13/18 2243 04/14/18 1830 04/15/18 0447  NA 135  --  140  K 3.1*  --  4.0  CL 102  --  107  CO2 25  --  25  GLUCOSE 103*  --  73  BUN 14  --  9  CREATININE 0.81  --  0.76  CALCIUM 8.1*  --  8.5*  MG  --  2.3 2.3   Liver Function Tests: Recent Labs  Lab 04/13/18 2243 04/15/18 0447  AST 19 14*  ALT 11 9  ALKPHOS 102 98  BILITOT 0.7 0.7  PROT 6.8 6.3*  ALBUMIN 3.1* 3.0*   No results for input(s): LIPASE, AMYLASE in the last 168 hours. No results for input(s): AMMONIA in the last 168 hours. CBC: Recent Labs  Lab 04/13/18 2243 04/15/18 0447  WBC 12.3* 7.3  NEUTROABS  --  4.9  HGB 12.1* 11.5*  HCT 38.4* 36.9*  MCV 88.9 90.9  PLT 375 435*   Cardiac Enzymes: No results for input(s): CKTOTAL, CKMB, CKMBINDEX, TROPONINI in the last 168 hours. BNP: Invalid input(s): POCBNP CBG: Recent Labs  Lab 04/13/18 2246  GLUCAP 98   D-Dimer No results for input(s): DDIMER in the last 72 hours. Hgb A1c No results for input(s): HGBA1C in the last 72 hours. Lipid Profile No results for input(s): CHOL, HDL, LDLCALC, TRIG, CHOLHDL, LDLDIRECT in the last 72 hours. Thyroid function studies No results for input(s): TSH, T4TOTAL, T3FREE, THYROIDAB in the last 72 hours.  Invalid input(s): FREET3 Anemia work up No results for input(s): VITAMINB12, FOLATE, FERRITIN, TIBC, IRON, RETICCTPCT in the last 72 hours. Urinalysis    Component Value Date/Time   COLORURINE YELLOW 04/14/2018 1235   APPEARANCEUR CLEAR 04/14/2018 1235   LABSPEC 1.017 04/14/2018 1235   PHURINE 5.0 04/14/2018 1235   GLUCOSEU NEGATIVE 04/14/2018 1235   HGBUR NEGATIVE 04/14/2018 1235   BILIRUBINUR  NEGATIVE 04/14/2018 1235  KETONESUR NEGATIVE 04/14/2018 1235   PROTEINUR NEGATIVE 04/14/2018 1235   NITRITE NEGATIVE 04/14/2018 1235   LEUKOCYTESUR NEGATIVE 04/14/2018 1235   Sepsis Labs Invalid input(s): PROCALCITONIN,  WBC,  LACTICIDVEN Microbiology No results found for this or any previous visit (from the past 240 hour(s)).  Time coordinating discharge: 32 minutes  SIGNED:  Standley Dakins, MD  [facility] 04/17/2018, 2:17 PM Pager [phone]  If 7PM-7AM, please contact night-coverage [email or web address] Password [email or web address]

## 2018-04-17 NOTE — Clinical Social Work Note (Signed)
Made [facility] referral on patient.

## 2018-04-17 NOTE — Progress Notes (Signed)
IV removed and discharge instructions reviewed with Dennis's wife.  Scripts sent to pharmacy.  Assisted to dress and with brace in [facility].  To have sutures removed in 3 days

## 2018-04-17 NOTE — Care Management (Addendum)
CM spoke with patients brother. Will make referral to OP PT as co-pay is less than that of HH. [facility].   Brother: Mertie MooresDennis Lehmkuhl  [phone]

## 2019-01-29 ENCOUNTER — Ambulatory Visit (HOSPITAL_COMMUNITY): Payer: No Typology Code available for payment source

## 2019-01-29 ENCOUNTER — Other Ambulatory Visit (HOSPITAL_COMMUNITY): Payer: Self-pay | Admitting: Internal Medicine

## 2019-01-29 ENCOUNTER — Encounter (HOSPITAL_COMMUNITY): Payer: Self-pay

## 2019-01-29 ENCOUNTER — Other Ambulatory Visit: Payer: Self-pay | Admitting: Internal Medicine

## 2019-01-29 DIAGNOSIS — R569 Unspecified convulsions: Secondary | ICD-10-CM

## 2019-02-25 IMAGING — CT CT L SPINE W/O CM
3 series · 10 of 33 positions shown, 12 images · non-contrast
Comparison: Prior radiographs from 04/13/2018.

CLINICAL DATA: Initial evaluation for acute trauma, fall. Lower
back pain.

EXAM:
CT LUMBAR SPINE WITHOUT CONTRAST
TECHNIQUE: Multidetector CT imaging of the lumbar spine was performed without
intravenous contrast administration. Multiplanar CT image
reconstructions were also generated.

[Series 4: l spine soft · axial · 0.29mm/px · z∈[+1816,+1930]mm · 2 of 124 slices shown, 3 images]
[im 38/124  soft-tissue]
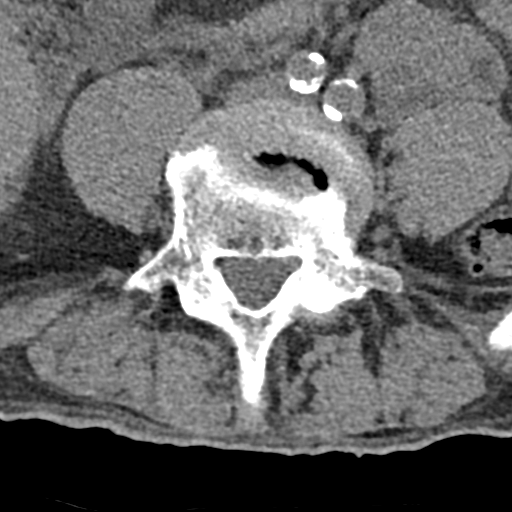
[im 38/124  bone]
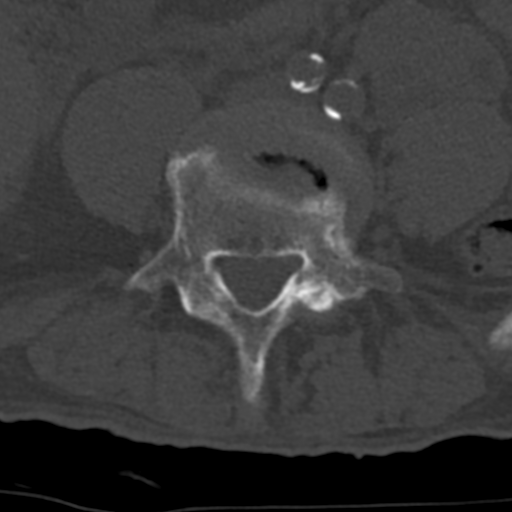
[im 95/124  bone]
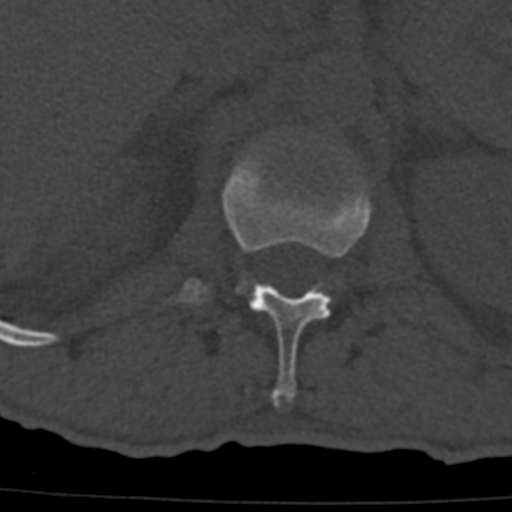

[Series 7: sagittal bone · sagittal · 0.29mm/px · 5 of 65 slices shown, 6 images]
[im 22/65  bone]
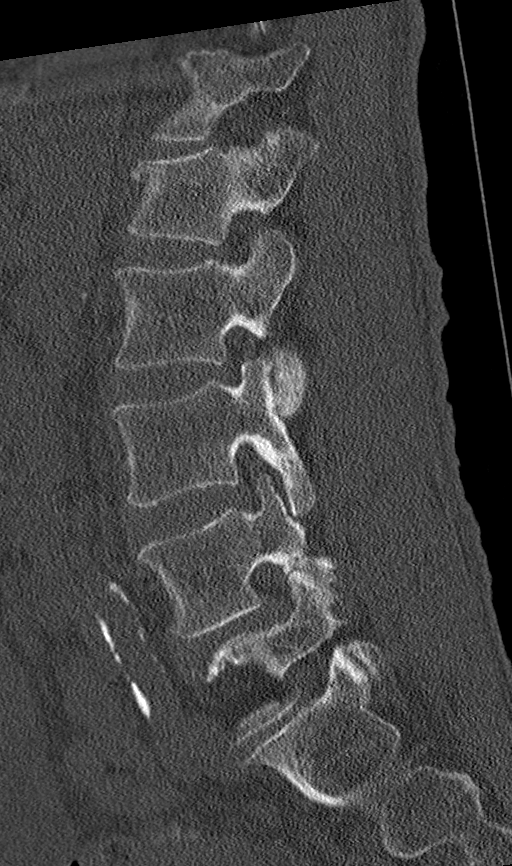
[im 27/65  bone]
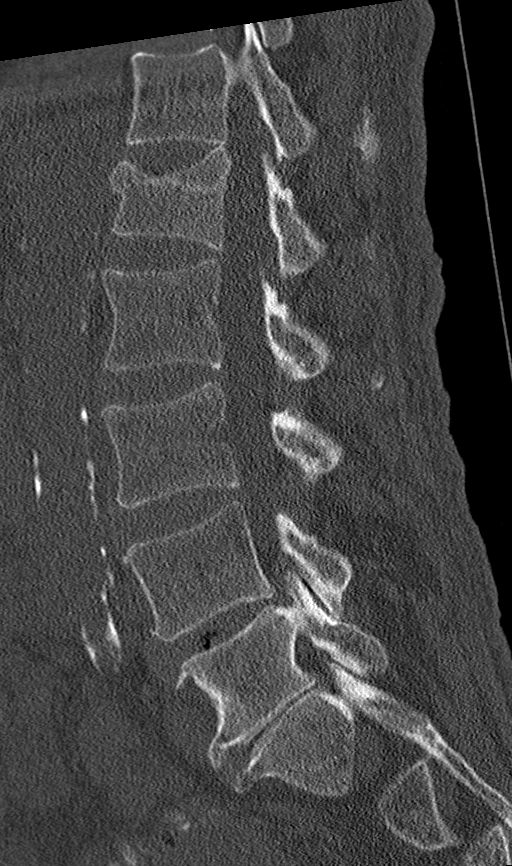
[im 33/65  soft-tissue]
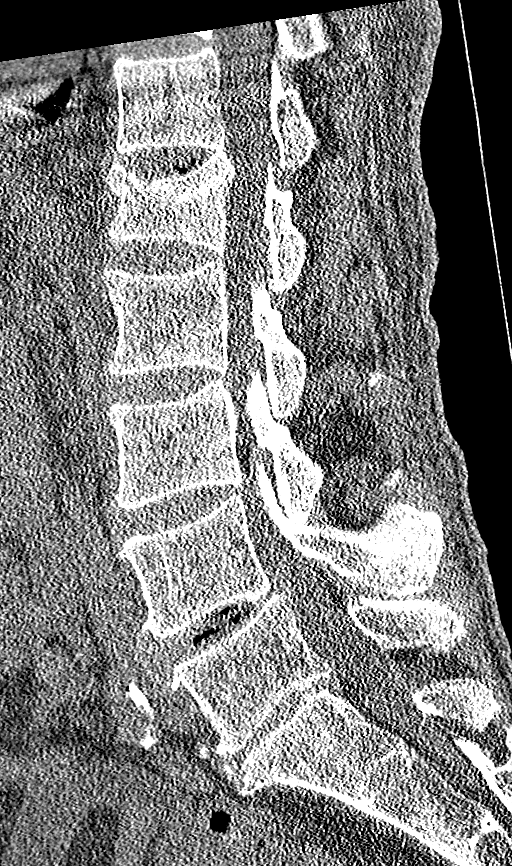
[im 33/65  bone]
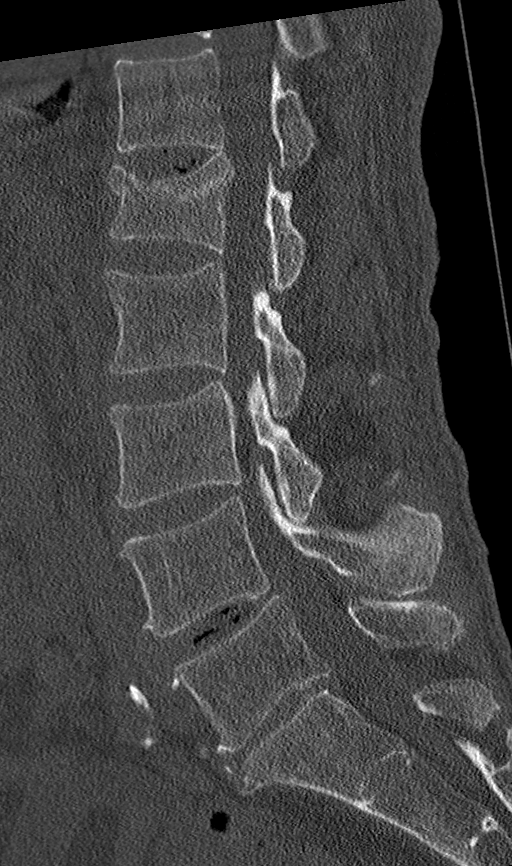
[im 38/65  bone]
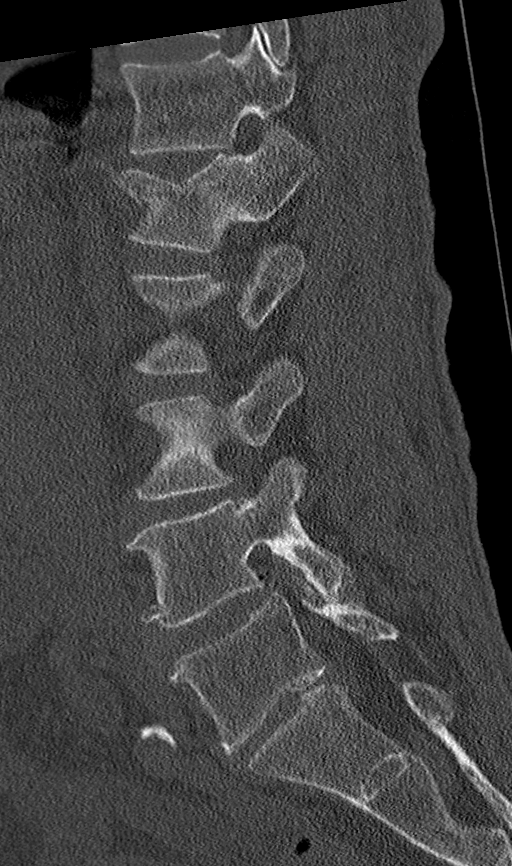
[im 43/65  bone]
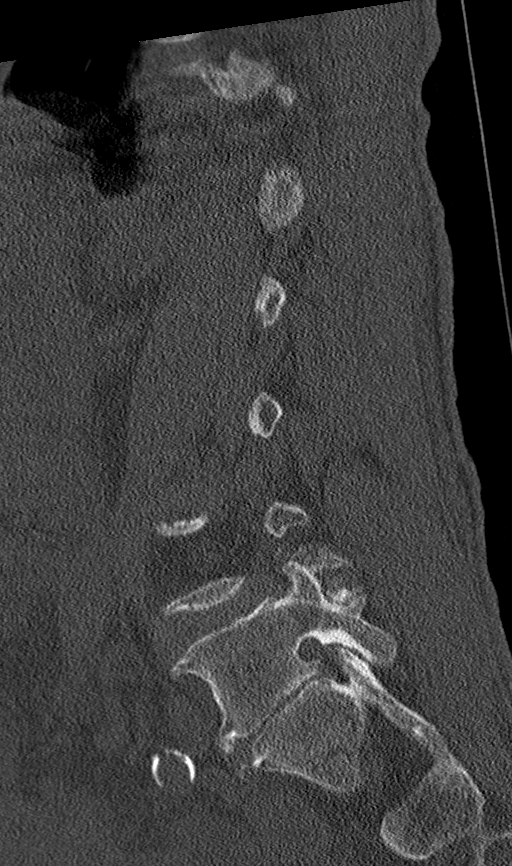

[Series 8: coronal bone · coronal · 0.32mm/px · 3 of 63 slices shown]
[im 13/63  bone]
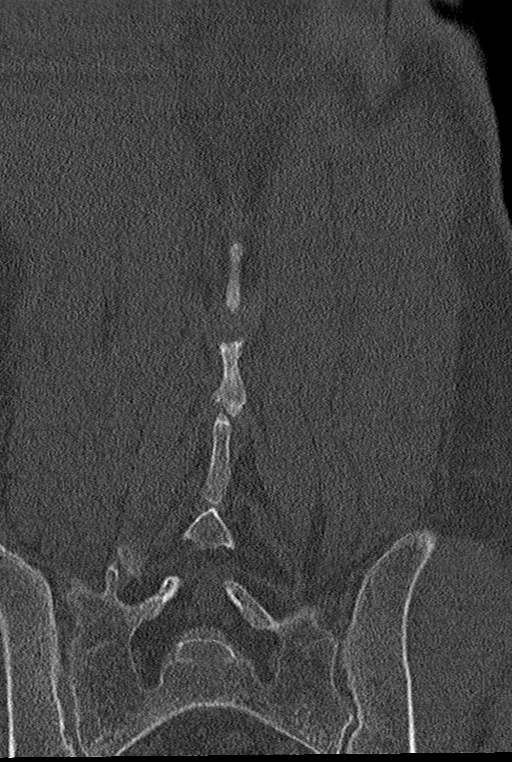
[im 25/63  bone]
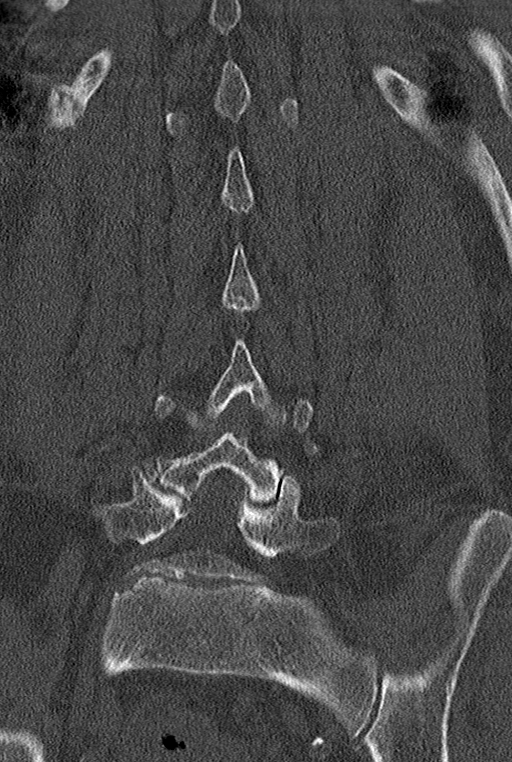
[im 38/63  bone]
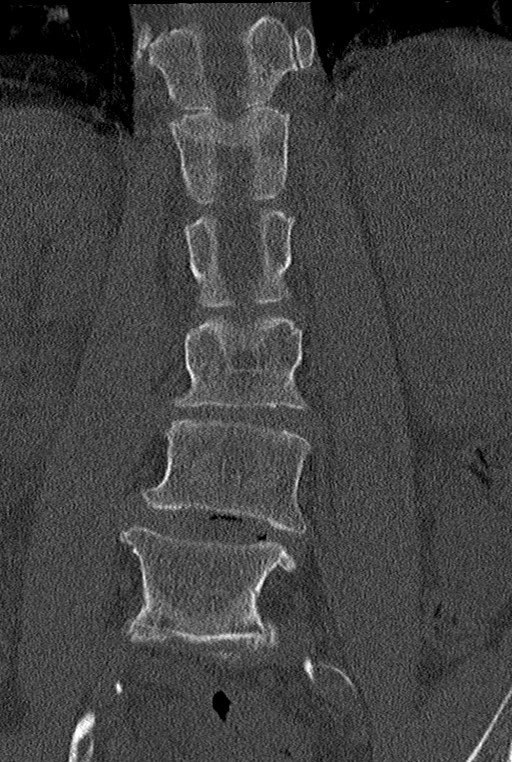

[10 of 33 positions shown; findings below may reference images not displayed]

FINDINGS: Segmentation: Normal segmentation. Lowest well-formed disc labeled
the L5-S1 level.

Alignment: Trace retrolisthesis of L5 on S1. Mild levoscoliosis.
Alignment otherwise normal with preservation of the normal lumbar
lordosis.

Vertebrae: Acute burst type compression fracture extends through the
superior endplate of L1. Associated height loss of up to 40% with 3
mm bony retropulsion.

Vertebral body heights otherwise maintained. No other acute fracture
identified. Visualized sacrum and pelvis intact. SI joints
approximated symmetric. Remotely healed fractures of the right
posterior eleventh and twelfth ribs noted. Additional remotely
healed fractures of the right transverse processes of L1, L2, L3,
and L4. Remotely healed fracture of the left transverse process of
L4. No discrete lytic or blastic osseous lesions.

Paraspinal and other soft tissues: Paraspinous soft tissues
demonstrate no acute finding. Advanced aorto bi-iliac
atherosclerotic disease. Visualized visceral structures otherwise
unremarkable.

Disc levels:

T12-L1: 3 mm bony retropulsion related to the acute L1 compression
fracture. Mild disc desiccation. Mild bilateral facet hypertrophy.
Central canal remains widely patent as do the bilateral neural
foramina.

L1-2:  Unremarkable.

L2-3: Minimal disc bulge. Mild facet hypertrophy. No significant
stenosis.

L3-4: Diffuse disc bulge, asymmetric to the right. Moderate facet
and ligamentum flavum hypertrophy, greater on the right. Resultant
moderate canal with bilateral subarticular stenosis, right greater
than left. Moderate right with mild left L3 foraminal narrowing.

L4-5: Chronic intervertebral disc space narrowing with disc
desiccation. Superimposed broad right foraminal/extraforaminal disc
protrusion (series 4, image 8). Protruding disc closely approximates
the exiting right L4 nerve root as it courses of the left neural
foramen. Additional disc bulge seen at the level of the left lateral
recess extending laterally towards the left neural foramen (series
4, image 82). Moderate to advanced facet hypertrophy, right greater
than left. Resultant moderate canal with right greater than left
lateral recess stenosis. Moderate right with mild to moderate left
L4 foraminal narrowing.

L5-S1: Chronic intervertebral disc space narrowing with diffuse disc
bulge and disc desiccation. Reactive endplate changes with marginal
endplate osteophytic spurring. Mild to moderate facet hypertrophy.
No significant spinal stenosis. Mild to moderate bilateral L5
foraminal narrowing.
IMPRESSION: 1. Acute burst type compression fracture involving the superior
endplate of L1 with associated 40% height loss and 3 mm bony
retropulsion. No significant stenosis.
2. No other acute traumatic injury within the lumbar spine.
3. Chronic remotely healed fractures involving the right posterior
eleventh and twelfth ribs as well as the right transverse processes
of L1 through L4, along with the left transverse process of L4.
4. Moderate sized right foraminal/extraforaminal disc protrusion at
L4-5, closely approximating and potentially affecting the exiting
right L4 nerve root.
5. Disc bulge with facet hypertrophy at L3-4 and L4-5 with resultant
moderate canal and bilateral subarticular stenosis as above.

## 2019-06-04 DEATH — deceased
